# Patient Record
Sex: Male | Born: 1970
Health system: Southern US, Community
[De-identification: ages and names within clinical notes are randomized; demographics above are authoritative.]

## PROBLEM LIST (undated history)

## (undated) DIAGNOSIS — N189 Chronic kidney disease, unspecified: Secondary | ICD-10-CM

## (undated) DIAGNOSIS — E785 Hyperlipidemia, unspecified: Secondary | ICD-10-CM

## (undated) DIAGNOSIS — Z87442 Personal history of urinary calculi: Secondary | ICD-10-CM

## (undated) DIAGNOSIS — Z8669 Personal history of other diseases of the nervous system and sense organs: Secondary | ICD-10-CM

## (undated) DIAGNOSIS — F419 Anxiety disorder, unspecified: Secondary | ICD-10-CM

## (undated) DIAGNOSIS — E669 Obesity, unspecified: Secondary | ICD-10-CM

## (undated) DIAGNOSIS — J309 Allergic rhinitis, unspecified: Secondary | ICD-10-CM

## (undated) DIAGNOSIS — M545 Low back pain: Secondary | ICD-10-CM

## (undated) DIAGNOSIS — T7840XA Allergy, unspecified, initial encounter: Secondary | ICD-10-CM

## (undated) DIAGNOSIS — G709 Myoneural disorder, unspecified: Secondary | ICD-10-CM

## (undated) DIAGNOSIS — F329 Major depressive disorder, single episode, unspecified: Secondary | ICD-10-CM

## (undated) DIAGNOSIS — J45909 Unspecified asthma, uncomplicated: Secondary | ICD-10-CM

## (undated) DIAGNOSIS — I10 Essential (primary) hypertension: Secondary | ICD-10-CM

## (undated) DIAGNOSIS — D649 Anemia, unspecified: Secondary | ICD-10-CM

## (undated) DIAGNOSIS — F32A Depression, unspecified: Secondary | ICD-10-CM

## (undated) HISTORY — DX: Low back pain: M54.5

## (undated) HISTORY — DX: Allergy, unspecified, initial encounter: T78.40XA

## (undated) HISTORY — DX: Allergic rhinitis, unspecified: J30.9

## (undated) HISTORY — DX: Personal history of other diseases of the nervous system and sense organs: Z86.69

## (undated) HISTORY — DX: Chronic kidney disease, unspecified: N18.9

## (undated) HISTORY — DX: Myoneural disorder, unspecified: G70.9

## (undated) HISTORY — DX: Obesity, unspecified: E66.9

## (undated) HISTORY — PX: NO PAST SURGERIES: SHX2092

## (undated) HISTORY — DX: Hyperlipidemia, unspecified: E78.5

## (undated) HISTORY — DX: Depression, unspecified: F32.A

## (undated) HISTORY — DX: Unspecified asthma, uncomplicated: J45.909

## (undated) HISTORY — DX: Personal history of urinary calculi: Z87.442

## (undated) HISTORY — DX: Anemia, unspecified: D64.9

---
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
## 2004-10-17 ENCOUNTER — Ambulatory Visit: Payer: Self-pay | Admitting: Internal Medicine

## 2005-03-10 ENCOUNTER — Ambulatory Visit: Payer: Self-pay | Admitting: Internal Medicine

## 2005-09-01 ENCOUNTER — Ambulatory Visit: Payer: Self-pay | Admitting: Internal Medicine

## 2005-10-01 ENCOUNTER — Ambulatory Visit: Payer: Self-pay | Admitting: Internal Medicine

## 2007-03-23 ENCOUNTER — Ambulatory Visit: Payer: Self-pay | Admitting: Internal Medicine

## 2007-03-23 LAB — CONVERTED CEMR LAB
ALT: 23 units/L (ref 0–40)
AST: 22 units/L (ref 0–37)
Basophils Relative: 0.2 % (ref 0.0–1.0)
Bilirubin, Direct: 0.1 mg/dL (ref 0.0–0.3)
CO2: 34 meq/L — ABNORMAL HIGH (ref 19–32)
Calcium: 9.5 mg/dL (ref 8.4–10.5)
Chloride: 107 meq/L (ref 96–112)
Creatinine, Ser: 1.1 mg/dL (ref 0.4–1.5)
Eosinophils Relative: 1.8 % (ref 0.0–5.0)
Glucose, Bld: 93 mg/dL (ref 70–99)
HDL: 26.3 mg/dL — ABNORMAL LOW (ref >39.0)
Platelets: 260 10*3/uL (ref 150–400)
RBC: 5.37 M/uL (ref 4.22–5.81)
Total Protein: 7.3 g/dL (ref 6.0–8.3)
Triglycerides: 238 mg/dL (ref 0–149)
VLDL: 48 mg/dL — ABNORMAL HIGH (ref 0–40)
WBC: 5.9 10*3/uL (ref 4.5–10.5)

## 2007-03-30 ENCOUNTER — Ambulatory Visit: Payer: Self-pay | Admitting: Internal Medicine

## 2007-05-03 DIAGNOSIS — J45909 Unspecified asthma, uncomplicated: Secondary | ICD-10-CM | POA: Insufficient documentation

## 2007-05-03 DIAGNOSIS — J309 Allergic rhinitis, unspecified: Secondary | ICD-10-CM | POA: Insufficient documentation

## 2007-05-03 DIAGNOSIS — E785 Hyperlipidemia, unspecified: Secondary | ICD-10-CM

## 2007-05-03 DIAGNOSIS — E669 Obesity, unspecified: Secondary | ICD-10-CM

## 2007-05-03 HISTORY — DX: Unspecified asthma, uncomplicated: J45.909

## 2007-05-03 HISTORY — DX: Allergic rhinitis, unspecified: J30.9

## 2007-05-03 HISTORY — DX: Obesity, unspecified: E66.9

## 2007-05-03 HISTORY — DX: Hyperlipidemia, unspecified: E78.5

## 2008-03-24 ENCOUNTER — Ambulatory Visit: Payer: Self-pay | Admitting: Internal Medicine

## 2008-03-24 LAB — CONVERTED CEMR LAB
ALT: 31 units/L (ref 0–53)
AST: 25 units/L (ref 0–37)
Alkaline Phosphatase: 54 units/L (ref 39–117)
Bilirubin Urine: NEGATIVE
Bilirubin, Direct: 0.1 mg/dL (ref 0.0–0.3)
CO2: 30 meq/L (ref 19–32)
Chloride: 102 meq/L (ref 96–112)
Cholesterol: 222 mg/dL (ref 0–200)
Glucose, Bld: 86 mg/dL (ref 70–99)
Hemoglobin: 15.3 g/dL (ref 13.0–17.0)
Lymphocytes Relative: 31 % (ref 12.0–46.0)
Monocytes Relative: 9.7 % (ref 3.0–12.0)
Neutro Abs: 2.9 10*3/uL (ref 1.4–7.7)
Neutrophils Relative %: 56.3 % (ref 43.0–77.0)
Potassium: 4.1 meq/L (ref 3.5–5.1)
Protein, U semiquant: NEGATIVE
RDW: 13.1 % (ref 11.5–14.6)
Sodium: 140 meq/L (ref 135–145)
Total CHOL/HDL Ratio: 7.9
Total Protein: 6.9 g/dL (ref 6.0–8.3)
Urobilinogen, UA: 0.2
VLDL: 71 mg/dL — ABNORMAL HIGH (ref 0–40)
WBC Urine, dipstick: NEGATIVE

## 2008-03-30 ENCOUNTER — Ambulatory Visit: Payer: Self-pay | Admitting: Internal Medicine

## 2008-03-30 DIAGNOSIS — M545 Low back pain, unspecified: Secondary | ICD-10-CM | POA: Insufficient documentation

## 2008-03-30 DIAGNOSIS — Z87442 Personal history of urinary calculi: Secondary | ICD-10-CM | POA: Insufficient documentation

## 2008-03-30 HISTORY — DX: Personal history of urinary calculi: Z87.442

## 2008-03-30 HISTORY — DX: Low back pain, unspecified: M54.50

## 2009-09-20 ENCOUNTER — Ambulatory Visit: Payer: Self-pay | Admitting: Internal Medicine

## 2009-09-20 LAB — CONVERTED CEMR LAB
ALT: 34 units/L (ref 0–53)
AST: 28 units/L (ref 0–37)
Alkaline Phosphatase: 52 units/L (ref 39–117)
BUN: 15 mg/dL (ref 6–23)
Basophils Absolute: 0 10*3/uL (ref 0.0–0.1)
Blood in Urine, dipstick: NEGATIVE
Calcium: 9.5 mg/dL (ref 8.4–10.5)
Eosinophils Relative: 2.5 % (ref 0.0–5.0)
GFR calc non Af Amer: 88.58 mL/min (ref >60)
HCT: 44 % (ref 39.0–52.0)
HDL: 28.3 mg/dL — ABNORMAL LOW (ref >39.00)
Hemoglobin: 15.4 g/dL (ref 13.0–17.0)
Ketones, urine, test strip: NEGATIVE
Lymphocytes Relative: 27.2 % (ref 12.0–46.0)
Lymphs Abs: 1.6 10*3/uL (ref 0.7–4.0)
Monocytes Relative: 9.6 % (ref 3.0–12.0)
Nitrite: NEGATIVE
Platelets: 192 10*3/uL (ref 150.0–400.0)
Potassium: 4 meq/L (ref 3.5–5.1)
Protein, U semiquant: NEGATIVE
RDW: 12.7 % (ref 11.5–14.6)
Sodium: 143 meq/L (ref 135–145)
TSH: 1.46 microintl units/mL (ref 0.35–5.50)
Total Bilirubin: 0.9 mg/dL (ref 0.3–1.2)
Triglycerides: 290 mg/dL — ABNORMAL HIGH (ref 0.0–149.0)
VLDL: 58 mg/dL — ABNORMAL HIGH (ref 0.0–40.0)
WBC Urine, dipstick: NEGATIVE
WBC: 5.9 10*3/uL (ref 4.5–10.5)

## 2009-09-28 ENCOUNTER — Ambulatory Visit: Payer: Self-pay | Admitting: Internal Medicine

## 2010-11-21 ENCOUNTER — Other Ambulatory Visit: Payer: Self-pay | Admitting: Internal Medicine

## 2010-12-16 ENCOUNTER — Other Ambulatory Visit: Payer: Self-pay | Admitting: Internal Medicine

## 2010-12-25 ENCOUNTER — Other Ambulatory Visit: Payer: Self-pay | Admitting: Internal Medicine

## 2011-01-15 ENCOUNTER — Other Ambulatory Visit: Payer: Self-pay | Admitting: Internal Medicine

## 2011-01-15 NOTE — Telephone Encounter (Signed)
Singular denied - last seen 09/2009 - last refill was instructed to make rov

## 2011-03-04 NOTE — Assessment & Plan Note (Signed)
[facility] OFFICE NOTE   MINDY, BEHNKEN                     MRN:          [id number]  DATE:03/30/2007                            DOB:          08-22-1971    A 40 year old gentleman who was seen today for an annual health  assessment.  He has history of asthma, allergic rhinitis, also a remote  history of kidney stones.  Medical regimen includes Singulair, Advair,  __________  albuterol, Flonase, Claritin.  His asthma is quite stable  although he still uses albuterol on the average of 2 times weekly.  He  only takes his maintenance drugs on the average of 4-5 times per week.   FAMILY HISTORY:  Both parents have been diagnosed recently with  diabetes, father has a history of paraplegia, mother with hypertension  and also diabetes insipidus.  Maternal grandmother had lung cancer.  An  uncle less than 50 had coronary artery disease.   EXAMINATION:  Revealed a mildly overweight male, no acute distress.  Blood pressure is 136/80.  FUNDI, EAR, NOSE, AND THROAT:  Clear.  NECK:  No bruits or adenopathy.  CHEST:  Clear.  CARDIOVASCULAR:  Normal heart sounds, no murmurs.  ABDOMEN:  Obese, soft but nontender, no organomegaly.  EXTERNAL GENITALIA:  Normal.  EXTREMITIES:  Revealed a diminished left dorsalis pedis pulses, other  peripheral pulses were normal.  NEURO:  Negative.   IMPRESSION:  1. Asthma.  2. Allergic rhinitis.  3. Dyslipidemia.  4. Family history of diabetes.  5. Exogenous obesity.   DISPOSITION:  Lifestyle issues discussed at length as far as better  diet, more exercise, and weight loss.  He will be given a trial of  Niaspan.  Has been asked to return in 3 month for a reassessment of his  lipid status and blood pressure.     Gordy Savers, MD  Electronically Signed    PFK/MedQ  DD: 03/30/2007  DT: 03/30/2007  Job #: [id number]

## 2011-03-14 ENCOUNTER — Other Ambulatory Visit (INDEPENDENT_AMBULATORY_CARE_PROVIDER_SITE_OTHER): Payer: 59

## 2011-03-14 DIAGNOSIS — Z0000 Encounter for general adult medical examination without abnormal findings: Secondary | ICD-10-CM

## 2011-03-14 LAB — CBC WITH DIFFERENTIAL/PLATELET
Eosinophils Absolute: 0.1 10*3/uL (ref 0.0–0.7)
Eosinophils Relative: 1.8 % (ref 0.0–5.0)
HCT: 43.7 % (ref 39.0–52.0)
Lymphs Abs: 1.9 10*3/uL (ref 0.7–4.0)
MCHC: 34.7 g/dL (ref 30.0–36.0)
MCV: 88.4 fl (ref 78.0–100.0)
Monocytes Absolute: 0.7 10*3/uL (ref 0.1–1.0)
Neutrophils Relative %: 59.6 % (ref 43.0–77.0)
Platelets: 180 10*3/uL (ref 150.0–400.0)
RDW: 14 % (ref 11.5–14.6)
WBC: 6.8 10*3/uL (ref 4.5–10.5)

## 2011-03-14 LAB — BASIC METABOLIC PANEL
BUN: 19 mg/dL (ref 6–23)
CO2: 29 mEq/L (ref 19–32)
Chloride: 104 mEq/L (ref 96–112)
Creatinine, Ser: 1 mg/dL (ref 0.4–1.5)
Glucose, Bld: 77 mg/dL (ref 70–99)
Potassium: 4.3 mEq/L (ref 3.5–5.1)

## 2011-03-14 LAB — POCT URINALYSIS DIPSTICK
Glucose, UA: NEGATIVE
Nitrite, UA: NEGATIVE
Protein, UA: NEGATIVE
Spec Grav, UA: 1.025
Urobilinogen, UA: 0.2

## 2011-03-14 LAB — TSH: TSH: 1.08 u[IU]/mL (ref 0.35–5.50)

## 2011-03-14 LAB — LDL CHOLESTEROL, DIRECT: Direct LDL: 114.7 mg/dL

## 2011-03-14 LAB — LIPID PANEL
HDL: 34.5 mg/dL — ABNORMAL LOW (ref >39.00)
Total CHOL/HDL Ratio: 6
VLDL: 84.4 mg/dL — ABNORMAL HIGH (ref 0.0–40.0)

## 2011-03-14 LAB — HEPATIC FUNCTION PANEL
ALT: 37 U/L (ref 0–53)
Bilirubin, Direct: 0.1 mg/dL (ref 0.0–0.3)
Total Bilirubin: 0.6 mg/dL (ref 0.3–1.2)

## 2011-03-25 ENCOUNTER — Encounter: Payer: Self-pay | Admitting: Internal Medicine

## 2011-03-31 ENCOUNTER — Encounter: Payer: Self-pay | Admitting: Internal Medicine

## 2011-03-31 ENCOUNTER — Ambulatory Visit (INDEPENDENT_AMBULATORY_CARE_PROVIDER_SITE_OTHER): Payer: 59 | Admitting: Internal Medicine

## 2011-03-31 VITALS — BP 120/80 | Temp 98.4°F | Resp 18 | Ht 71.0 in | Wt 258.0 lb

## 2011-03-31 DIAGNOSIS — Z0000 Encounter for general adult medical examination without abnormal findings: Secondary | ICD-10-CM

## 2011-03-31 MED ORDER — MONTELUKAST SODIUM 10 MG PO TABS: 10.0000 mg | ORAL_TABLET | Freq: Every day | ORAL | Status: DC

## 2011-03-31 MED ORDER — ALBUTEROL SULFATE HFA 108 (90 BASE) MCG/ACT IN AERS: 2.0000 | INHALATION_SPRAY | RESPIRATORY_TRACT | Status: DC | PRN

## 2011-03-31 MED ORDER — FLUTICASONE-SALMETEROL 250-50 MCG/DOSE IN AEPB: 1.0000 | INHALATION_SPRAY | Freq: Two times a day (BID) | RESPIRATORY_TRACT | Status: DC

## 2011-03-31 MED ORDER — FLUTICASONE PROPIONATE 50 MCG/ACT NA SUSP: 2.0000 | Freq: Every day | NASAL | Status: DC

## 2011-03-31 NOTE — Patient Instructions (Signed)
It is important that you exercise regularly, at least 20 minutes 3 to 4 times per week.  If you develop chest pain or shortness of breath seek  medical attention.  You need to lose weight.  Consider a lower calorie diet and regular exercise.  Call or return to clinic prn if these symptoms worsen or fail to improve as anticipated.  

## 2011-03-31 NOTE — Progress Notes (Signed)
Subjective:    Patient ID: Jason Benitez, male    DOB: Apr 21, 1971, 40 y.o.   MRN: [id number]  HPI   CC: CPX and labs done. Out of meds..  History of Present Illness:  40 year old patient who is seen today for a health maintenance examination. Medical problems include the allergic rhinitis and a history of asthma. He has exogenous Obesity and history of dyslipidemia. He has done quite well. He does require rescue albuterol, usually two to 3 times per week.  Allergies (verified):  No Known Drug Allergies  Past History:  Past Medical History:  Low back pain  Allergic rhinitis  Asthma  Nephrolithiasis, hx of  exogenous obesity  Past Surgical History:  none  Family History:  Reviewed history from 03/30/2008 and no changes required.  father history of diabetes, but pancreatic cancer age 60  mother history of hypertension, diabetes insipidus  maternal grandmother lung cancer  one uncle with CAD at age less than age 59  Social History:  Reviewed history from 03/30/2008 and no changes required.  Married  two young sons  discontinuation of tobacco, January 2009     Review of Systems  Constitutional: Negative for fever, chills, activity change, appetite change and fatigue.  HENT: Negative for hearing loss, ear pain, congestion, rhinorrhea, sneezing, mouth sores, trouble swallowing, neck pain, neck stiffness, dental problem, voice change, sinus pressure and tinnitus.   Eyes: Negative for photophobia, pain, redness and visual disturbance.  Respiratory: Negative for apnea, cough, choking, chest tightness, shortness of breath and wheezing.   Cardiovascular: Negative for chest pain, palpitations and leg swelling.  Gastrointestinal: Negative for nausea, vomiting, abdominal pain, diarrhea, constipation, blood in stool, abdominal distention, anal bleeding and rectal pain.       Complains of irritable bowel with 3-5 daily bowel movements  Genitourinary: Negative for dysuria, urgency,  frequency, hematuria, flank pain, decreased urine volume, discharge, penile swelling, scrotal swelling, difficulty urinating, genital sores and testicular pain.  Musculoskeletal: Negative for myalgias, back pain, joint swelling, arthralgias and gait problem.  Skin: Negative for color change, rash and wound.  Neurological: Negative for dizziness, tremors, seizures, syncope, facial asymmetry, speech difficulty, weakness, light-headedness, numbness and headaches.  Hematological: Negative for adenopathy. Does not bruise/bleed easily.  Psychiatric/Behavioral: Negative for suicidal ideas, hallucinations, behavioral problems, confusion, sleep disturbance, self-injury, dysphoric mood, decreased concentration and agitation. The patient is not nervous/anxious.        Objective:   Physical Exam  Constitutional: He appears well-developed and well-nourished.  HENT:  Head: Normocephalic and atraumatic.  Right Ear: External ear normal.  Left Ear: External ear normal.  Nose: Nose normal.  Mouth/Throat: Oropharynx is clear and moist.  Eyes: Conjunctivae and EOM are normal. Pupils are equal, round, and reactive to light. No scleral icterus.  Neck: Normal range of motion. Neck supple. No JVD present. No thyromegaly present.  Cardiovascular: Regular rhythm, normal heart sounds and intact distal pulses.  Exam reveals no gallop and no friction rub.   No murmur heard. Pulmonary/Chest: Effort normal and breath sounds normal. He exhibits no tenderness.  Abdominal: Soft. Bowel sounds are normal. He exhibits no distension and no mass. There is no tenderness.  Genitourinary: Prostate normal and penis normal.  Musculoskeletal: Normal range of motion. He exhibits no edema and no tenderness.  Lymphadenopathy:    He has no cervical adenopathy.  Neurological: He is alert. He has normal reflexes. No cranial nerve deficit. Coordination normal.  Skin: Skin is warm and dry. No rash noted.  Psychiatric: He  has a normal mood  and affect. His behavior is normal.          Assessment & Plan:    Annual clinical exam Asthma stable  mild exogenous obesity

## 2012-12-16 ENCOUNTER — Other Ambulatory Visit: Payer: 59

## 2012-12-23 ENCOUNTER — Encounter: Payer: 59 | Admitting: Internal Medicine

## 2013-03-08 ENCOUNTER — Encounter: Payer: Self-pay | Admitting: Internal Medicine

## 2013-03-08 ENCOUNTER — Other Ambulatory Visit (INDEPENDENT_AMBULATORY_CARE_PROVIDER_SITE_OTHER): Payer: 59

## 2013-03-08 DIAGNOSIS — Z0000 Encounter for general adult medical examination without abnormal findings: Secondary | ICD-10-CM

## 2013-03-08 LAB — POCT URINALYSIS DIPSTICK
Bilirubin, UA: NEGATIVE
Glucose, UA: NEGATIVE
Leukocytes, UA: NEGATIVE
Nitrite, UA: NEGATIVE
pH, UA: 6

## 2013-03-08 LAB — CBC WITH DIFFERENTIAL/PLATELET
Basophils Absolute: 0 10*3/uL (ref 0.0–0.1)
Basophils Relative: 0.8 % (ref 0.0–3.0)
Eosinophils Absolute: 0.1 10*3/uL (ref 0.0–0.7)
Hemoglobin: 15.4 g/dL (ref 13.0–17.0)
Lymphocytes Relative: 28.1 % (ref 12.0–46.0)
Lymphs Abs: 1.5 10*3/uL (ref 0.7–4.0)
MCHC: 34.5 g/dL (ref 30.0–36.0)
MCV: 85.6 fl (ref 78.0–100.0)
Monocytes Absolute: 0.5 10*3/uL (ref 0.1–1.0)
Monocytes Relative: 8.8 % (ref 3.0–12.0)
Neutro Abs: 3.3 10*3/uL (ref 1.4–7.7)
Platelets: 204 10*3/uL (ref 150.0–400.0)
RDW: 13.8 % (ref 11.5–14.6)

## 2013-03-08 LAB — HEPATIC FUNCTION PANEL
Alkaline Phosphatase: 49 U/L (ref 39–117)
Bilirubin, Direct: 0 mg/dL (ref 0.0–0.3)
Total Bilirubin: 0.9 mg/dL (ref 0.3–1.2)
Total Protein: 7.1 g/dL (ref 6.0–8.3)

## 2013-03-08 LAB — BASIC METABOLIC PANEL
BUN: 16 mg/dL (ref 6–23)
CO2: 30 mEq/L (ref 19–32)
Calcium: 9.1 mg/dL (ref 8.4–10.5)
Creatinine, Ser: 1.2 mg/dL (ref 0.4–1.5)

## 2013-03-08 LAB — LIPID PANEL: Triglycerides: 191 mg/dL — ABNORMAL HIGH (ref 0.0–149.0)

## 2013-03-08 LAB — TSH: TSH: 0.62 u[IU]/mL (ref 0.35–5.50)

## 2013-03-15 ENCOUNTER — Ambulatory Visit (INDEPENDENT_AMBULATORY_CARE_PROVIDER_SITE_OTHER): Payer: 59 | Admitting: Internal Medicine

## 2013-03-15 ENCOUNTER — Encounter: Payer: Self-pay | Admitting: Internal Medicine

## 2013-03-15 VITALS — BP 140/96 | HR 108 | Temp 98.5°F | Resp 20 | Ht 70.0 in | Wt 236.0 lb

## 2013-03-15 DIAGNOSIS — J309 Allergic rhinitis, unspecified: Secondary | ICD-10-CM

## 2013-03-15 DIAGNOSIS — J45909 Unspecified asthma, uncomplicated: Secondary | ICD-10-CM

## 2013-03-15 DIAGNOSIS — E785 Hyperlipidemia, unspecified: Secondary | ICD-10-CM

## 2013-03-15 DIAGNOSIS — E669 Obesity, unspecified: Secondary | ICD-10-CM

## 2013-03-15 DIAGNOSIS — Z0000 Encounter for general adult medical examination without abnormal findings: Secondary | ICD-10-CM

## 2013-03-15 MED ORDER — FLUTICASONE PROPIONATE 50 MCG/ACT NA SUSP: 2.0000 | Freq: Every day | NASAL | Status: DC

## 2013-03-15 MED ORDER — ALBUTEROL SULFATE HFA 108 (90 BASE) MCG/ACT IN AERS: 2.0000 | INHALATION_SPRAY | RESPIRATORY_TRACT | Status: DC | PRN

## 2013-03-15 MED ORDER — FLUTICASONE-SALMETEROL 250-50 MCG/DOSE IN AEPB: 1.0000 | INHALATION_SPRAY | Freq: Two times a day (BID) | RESPIRATORY_TRACT | Status: DC

## 2013-03-15 NOTE — Patient Instructions (Addendum)
It is important that you exercise regularly, at least 20 minutes 3 to 4 times per week.  If you develop chest pain or shortness of breath seek  medical attention.  You need to lose weight.  Consider a lower calorie diet and regular exercise.Health Maintenance, Males A healthy lifestyle and preventative care can promote health and wellness.  Maintain regular health, dental, and eye exams.  Eat a healthy diet. Foods like vegetables, fruits, whole grains, low-fat dairy products, and lean protein foods contain the nutrients you need without too many calories. Decrease your intake of foods high in solid fats, added sugars, and salt. Get information about a proper diet from your caregiver, if necessary.  Regular physical exercise is one of the most important things you can do for your health. Most adults should get at least 150 minutes of moderate-intensity exercise (any activity that increases your heart rate and causes you to sweat) each week. In addition, most adults need muscle-strengthening exercises on 2 or more days a week.   Maintain a healthy weight. The body mass index (BMI) is a screening tool to identify possible weight problems. It provides an estimate of body fat based on height and weight. Your caregiver can help determine your BMI, and can help you achieve or maintain a healthy weight. For adults 20 years and older:  A BMI below 18.5 is considered underweight.  A BMI of 18.5 to 24.9 is normal.  A BMI of 25 to 29.9 is considered overweight.  A BMI of 30 and above is considered obese.  Maintain normal blood lipids and cholesterol by exercising and minimizing your intake of saturated fat. Eat a balanced diet with plenty of fruits and vegetables. Blood tests for lipids and cholesterol should begin at age 20 and be repeated every 5 years. If your lipid or cholesterol levels are high, you are over 50, or you are a high risk for heart disease, you may need your cholesterol levels checked  more frequently.Ongoing high lipid and cholesterol levels should be treated with medicines, if diet and exercise are not effective.  If you smoke, find out from your caregiver how to quit. If you do not use tobacco, do not start.  If you choose to drink alcohol, do not exceed 2 drinks per day. One drink is considered to be 12 ounces (355 mL) of beer, 5 ounces (148 mL) of wine, or 1.5 ounces (44 mL) of liquor.  Avoid use of street drugs. Do not share needles with anyone. Ask for help if you need support or instructions about stopping the use of drugs.  High blood pressure causes heart disease and increases the risk of stroke. Blood pressure should be checked at least every 1 to 2 years. Ongoing high blood pressure should be treated with medicines if weight loss and exercise are not effective.  If you are 45 to 42 years old, ask your caregiver if you should take aspirin to prevent heart disease.  Diabetes screening involves taking a blood sample to check your fasting blood sugar level. This should be done once every 3 years, after age 45, if you are within normal weight and without risk factors for diabetes. Testing should be considered at a younger age or be carried out more frequently if you are overweight and have at least 1 risk factor for diabetes.  Colorectal cancer can be detected and often prevented. Most routine colorectal cancer screening begins at the age of 50 and continues through age 75. However, your caregiver   may recommend screening at an earlier age if you have risk factors for colon cancer. On a yearly basis, your caregiver may provide home test kits to check for hidden blood in the stool. Use of a small camera at the end of a tube, to directly examine the colon (sigmoidoscopy or colonoscopy), can detect the earliest forms of colorectal cancer. Talk to your caregiver about this at age 50, when routine screening begins. Direct examination of the colon should be repeated every 5 to 10  years through age 75, unless early forms of pre-cancerous polyps or small growths are found.  Hepatitis C blood testing is recommended for all people born from 1945 through 1965 and any individual with known risks for hepatitis C.  Healthy men should no longer receive prostate-specific antigen (PSA) blood tests as part of routine cancer screening. Consult with your caregiver about prostate cancer screening.  Testicular cancer screening is not recommended for adolescents or adult males who have no symptoms. Screening includes self-exam, caregiver exam, and other screening tests. Consult with your caregiver about any symptoms you have or any concerns you have about testicular cancer.  Practice safe sex. Use condoms and avoid high-risk sexual practices to reduce the spread of sexually transmitted infections (STIs).  Use sunscreen with a sun protection factor (SPF) of 30 or greater. Apply sunscreen liberally and repeatedly throughout the day. You should seek shade when your shadow is shorter than you. Protect yourself by wearing long sleeves, pants, a wide-brimmed hat, and sunglasses year round, whenever you are outdoors.  Notify your caregiver of new moles or changes in moles, especially if there is a change in shape or color. Also notify your caregiver if a mole is larger than the size of a pencil eraser.  A one-time screening for abdominal aortic aneurysm (AAA) and surgical repair of large AAAs by sound wave imaging (ultrasonography) is recommended for ages 65 to 75 years who are current or former smokers.  Stay current with your immunizations. Document Released: 04/03/2008 Document Revised: 12/29/2011 Document Reviewed: 03/03/2011 [employer] Patient Information 2014 [employer].  

## 2013-03-15 NOTE — Progress Notes (Signed)
Patient ID: Jason Benitez, male   DOB: Jan 27, 1971, 42 y.o.   MRN: [id number]  Subjective:    Patient ID: Jason Benitez, male    DOB: [birth year], 42 y.o.   MRN: [id number]  HPI   CC: CPX and labs done. Out of meds..  History of Present Illness:   42 -year-old patient who is seen today for a health maintenance examination. Medical problems include the allergic rhinitis and a history of asthma. He has exogenous Obesity and history of dyslipidemia. He has done quite well. He does require rescue albuterol, usually two to 3 times per week. He no longer takes Advair.  Approximately 6 months ago a laboratory screen was performed at work that revealed worsening dyslipidemia. For the past 6 months he has been on a much better diet and exercise regimen and has lost 30 pounds in weight.  Allergies (verified):  No Known Drug Allergies   Past History:  Past Medical History:  Low back pain  Allergic rhinitis  Asthma  Nephrolithiasis, hx of  exogenous obesity   Past Surgical History:  none   Family History:  Reviewed history from 03/30/2008 and no changes required.  father history of diabetes, but pancreatic cancer age 81  mother history of hypertension, diabetes insipidus  maternal grandmother lung cancer  one uncle with CAD at age less than age 19   Social History:  Reviewed history from 03/30/2008 and no changes required.  Married  two young sons  discontinuation of tobacco, January 2009     Review of Systems  Constitutional: Negative for fever, chills, activity change, appetite change and fatigue.  HENT: Negative for hearing loss, ear pain, congestion, rhinorrhea, sneezing, mouth sores, trouble swallowing, neck pain, neck stiffness, dental problem, voice change, sinus pressure and tinnitus.   Eyes: Negative for photophobia, pain, redness and visual disturbance.  Respiratory: Negative for apnea, cough, choking, chest tightness, shortness of breath and wheezing.   Cardiovascular:  Negative for chest pain, palpitations and leg swelling.  Gastrointestinal: Negative for nausea, vomiting, abdominal pain, diarrhea, constipation, blood in stool, abdominal distention, anal bleeding and rectal pain.       Complains of irritable bowel with 3-5 daily bowel movements  Genitourinary: Negative for dysuria, urgency, frequency, hematuria, flank pain, decreased urine volume, discharge, penile swelling, scrotal swelling, difficulty urinating, genital sores and testicular pain.  Musculoskeletal: Negative for myalgias, back pain, joint swelling, arthralgias and gait problem.  Skin: Negative for color change, rash and wound.  Neurological: Negative for dizziness, tremors, seizures, syncope, facial asymmetry, speech difficulty, weakness, light-headedness, numbness and headaches.  Hematological: Negative for adenopathy. Does not bruise/bleed easily.  Psychiatric/Behavioral: Negative for suicidal ideas, hallucinations, behavioral problems, confusion, sleep disturbance, self-injury, dysphoric mood, decreased concentration and agitation. The patient is not nervous/anxious.        Objective:   Physical Exam  Constitutional: He appears well-developed and well-nourished.  BP  130/82  HENT:  Head: Normocephalic and atraumatic.  Right Ear: External ear normal.  Left Ear: External ear normal.  Nose: Nose normal.  Mouth/Throat: Oropharynx is clear and moist.  Eyes: Conjunctivae and EOM are normal. Pupils are equal, round, and reactive to light. No scleral icterus.  Neck: Normal range of motion. Neck supple. No JVD present. No thyromegaly present.  Cardiovascular: Regular rhythm, normal heart sounds and intact distal pulses.  Exam reveals no gallop and no friction rub.   No murmur heard. Pulmonary/Chest: Effort normal and breath sounds normal. He exhibits no tenderness.  Abdominal: Soft. Bowel sounds  are normal. He exhibits no distension and no mass. There is no tenderness.  Genitourinary:  Prostate normal and penis normal.  Musculoskeletal: Normal range of motion. He exhibits no edema and no tenderness.  Lymphadenopathy:    He has no cervical adenopathy.  Neurological: He is alert. He has normal reflexes. No cranial nerve deficit. Coordination normal.  Skin: Skin is warm and dry. No rash noted.  Psychiatric: He has a normal mood and affect. His behavior is normal.          Assessment & Plan:    Annual clinical exam Asthma stable  exogenous obesity  continue efforts at diet and exercise Dyslipidemia. Much improved with lifestyle changes

## 2013-08-25 ENCOUNTER — Other Ambulatory Visit: Payer: Self-pay

## 2014-03-09 ENCOUNTER — Ambulatory Visit (INDEPENDENT_AMBULATORY_CARE_PROVIDER_SITE_OTHER): Payer: 59 | Admitting: Internal Medicine

## 2014-03-09 ENCOUNTER — Encounter: Payer: Self-pay | Admitting: Internal Medicine

## 2014-03-09 VITALS — BP 120/98 | HR 90 | Temp 98.4°F | Wt 248.0 lb

## 2014-03-09 DIAGNOSIS — E669 Obesity, unspecified: Secondary | ICD-10-CM

## 2014-03-09 DIAGNOSIS — J45909 Unspecified asthma, uncomplicated: Secondary | ICD-10-CM

## 2014-03-09 DIAGNOSIS — F410 Panic disorder [episodic paroxysmal anxiety] without agoraphobia: Secondary | ICD-10-CM

## 2014-03-09 MED ORDER — BUPROPION HCL ER (SR) 150 MG PO TB12: 150.0000 mg | ORAL_TABLET | Freq: Two times a day (BID) | ORAL | Status: DC

## 2014-03-09 NOTE — Progress Notes (Signed)
Pre visit review using our clinic review tool, if applicable. No additional management support is needed unless otherwise documented below in the visit note. 

## 2014-03-09 NOTE — Patient Instructions (Addendum)
Call or return to clinic prn if these symptoms worsen or fail to improve as anticipated.  Moderate caffeine and nicotine exposure    It is important that you exercise regularly, at least 20 minutes 3 to 4 times per week.  If you develop chest pain or shortness of breath seek  medical attention.
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
## 2014-07-19 ENCOUNTER — Ambulatory Visit (INDEPENDENT_AMBULATORY_CARE_PROVIDER_SITE_OTHER): Payer: 59 | Admitting: Internal Medicine

## 2014-07-19 ENCOUNTER — Encounter: Payer: Self-pay | Admitting: Internal Medicine

## 2014-07-19 VITALS — BP 130/90 | HR 116 | Temp 98.8°F | Resp 20 | Ht 70.0 in | Wt 251.0 lb

## 2014-07-19 DIAGNOSIS — R071 Chest pain on breathing: Secondary | ICD-10-CM

## 2014-07-19 DIAGNOSIS — R0789 Other chest pain: Secondary | ICD-10-CM

## 2014-07-19 DIAGNOSIS — K219 Gastro-esophageal reflux disease without esophagitis: Secondary | ICD-10-CM

## 2014-07-19 MED ORDER — OMEPRAZOLE 20 MG PO CPDR: 20.0000 mg | DELAYED_RELEASE_CAPSULE | Freq: Every day | ORAL | Status: DC

## 2014-07-19 NOTE — Progress Notes (Signed)
Pre visit review using our clinic review tool, if applicable. No additional management support is needed unless otherwise documented below in the visit note. 

## 2014-07-19 NOTE — Progress Notes (Signed)
Subjective:    Patient ID: Jason Benitez, male    DOB: 1971-06-27, 43 y.o.   MRN: [id number]  HPI  43 year old patient who has a history of mild asthma and allergic rhinitis.  For the past week.  He has had some soreness across the left anterior chest wall with some radiation to the left posterior chest wall area.  He describes some intermittent abdominal pain and diarrhea.  He also describes a some increasing fullness and burning associated with meals.  These symptoms are worse at night when he lays flat.  At times, his left anterior chest wall pain seems aggravated by movement, especially flexion of the head and neck  Past Medical History  Diagnosis Date  . ALLERGIC RHINITIS 05/03/2007  . ASTHMA 05/03/2007  . DYSLIPIDEMIA 05/03/2007  . EXOGENOUS OBESITY 05/03/2007  . LOW BACK PAIN 03/30/2008  . NEPHROLITHIASIS, HX OF 03/30/2008    History   Social History  . Marital Status: Married    Spouse Name: N/A    Number of Children: N/A  . Years of Education: N/A   Occupational History  . Not on file.   Social History Main Topics  . Smoking status: Former Smoker    Quit date: 10/20/1990  . Smokeless tobacco: Never Used  . Alcohol Use: Yes  . Drug Use: No  . Sexual Activity: Not on file   Other Topics Concern  . Not on file   Social History Narrative  . No narrative on file    History reviewed. No pertinent past surgical history.  Family History  Problem Relation Age of Onset  . Diabetes Mother   . Hypertension Mother   . Diabetes Father   . Cancer Father     pancreatic  . Cancer Maternal Grandfather     lung ca    No Known Allergies  Current Outpatient Prescriptions on File Prior to Visit  Medication Sig Dispense Refill  . albuterol (PROAIR HFA) 108 (90 BASE) MCG/ACT inhaler Inhale 2 puffs into the lungs every 4 (four) hours as needed.  1 Inhaler  5  . buPROPion (WELLBUTRIN SR) 150 MG 12 hr tablet Take 1 tablet (150 mg total) by mouth 2 (two) times daily.  60  tablet  3  . fluticasone (FLONASE) 50 MCG/ACT nasal spray Place 2 sprays into the nose daily.  16 g  5   No current facility-administered medications on file prior to visit.    BP 130/90  Pulse 116  Temp(Src) 98.8 F (37.1 C) (Oral)  Resp 20  Ht 5\' 10"  (1.778 m)  Wt 251 lb (113.853 kg)  BMI 36.01 kg/m2  SpO2 98%     Review of Systems  Constitutional: Negative for fever, chills, appetite change and fatigue.  HENT: Negative for congestion, dental problem, ear pain, hearing loss, sore throat, tinnitus, trouble swallowing and voice change.   Eyes: Negative for pain, discharge and visual disturbance.  Respiratory: Negative for cough, chest tightness, wheezing and stridor.   Cardiovascular: Positive for chest pain. Negative for palpitations and leg swelling.  Gastrointestinal: Negative for nausea, vomiting, abdominal pain, diarrhea, constipation, blood in stool and abdominal distention.  Genitourinary: Negative for urgency, hematuria, flank pain, discharge, difficulty urinating and genital sores.  Musculoskeletal: Negative for arthralgias, back pain, gait problem, joint swelling, myalgias and neck stiffness.  Skin: Negative for rash.  Neurological: Negative for dizziness, syncope, speech difficulty, weakness, numbness and headaches.  Hematological: Negative for adenopathy. Does not bruise/bleed easily.  Psychiatric/Behavioral: Negative for behavioral problems  and dysphoric mood. The patient is not nervous/anxious.        Objective:   Physical Exam  Constitutional: He is oriented to person, place, and time. He appears well-developed and well-nourished. No distress.  HENT:  Head: Normocephalic.  Right Ear: External ear normal.  Left Ear: External ear normal.  Eyes: Conjunctivae and EOM are normal.  Neck: Normal range of motion.  Cardiovascular: Normal rate and normal heart sounds.   Pulmonary/Chest: Effort normal and breath sounds normal. No respiratory distress. He has no  wheezes. He has no rales. He exhibits no tenderness.  Abdominal: Bowel sounds are normal.  Musculoskeletal: Normal range of motion. He exhibits no edema and no tenderness.  Neurological: He is alert and oriented to person, place, and time.  Psychiatric: He has a normal mood and affect. His behavior is normal.          Assessment & Plan:   Believe that patient has a component of both chest wall discomfort, as well as reflux symptoms.  We'll place on an aggressive antireflux regimen and observe Asthma stable

## 2014-07-19 NOTE — Patient Instructions (Signed)
Avoids foods high in acid such as tomatoes citrus juices, and spicy foods.  Avoid eating within two hours of lying down or before exercising.  Do not overheat.  Try smaller more frequent meals.    Food Choices for Gastroesophageal Reflux Disease When you have gastroesophageal reflux disease (GERD), the foods you eat and your eating habits are very important. Choosing the right foods can help ease the discomfort of GERD. WHAT GENERAL GUIDELINES DO I NEED TO FOLLOW?  Choose fruits, vegetables, whole grains, low-fat dairy products, and low-fat meat, fish, and poultry.  Limit fats such as oils, salad dressings, butter, nuts, and avocado.  Keep a food diary to identify foods that cause symptoms.  Avoid foods that cause reflux. These may be different for different people.  Eat frequent small meals instead of three large meals each day.  Eat your meals slowly, in a relaxed setting.  Limit fried foods.  Cook foods using methods other than frying.  Avoid drinking alcohol.  Avoid drinking large amounts of liquids with your meals.  Avoid bending over or lying down until 2-3 hours after eating. WHAT FOODS ARE NOT RECOMMENDED? The following are some foods and drinks that may worsen your symptoms: Vegetables Tomatoes. Tomato juice. Tomato and spaghetti sauce. Chili peppers. Onion and garlic. Horseradish. Fruits Oranges, grapefruit, and lemon (fruit and juice). Meats High-fat meats, fish, and poultry. This includes hot dogs, ribs, ham, sausage, salami, and bacon. Dairy Whole milk and chocolate milk. Sour cream. Cream. Butter. Ice cream. Cream cheese.  Beverages Coffee and tea, with or without caffeine. Carbonated beverages or energy drinks. Condiments Hot sauce. Barbecue sauce.  Sweets/Desserts Chocolate and cocoa. Donuts. Peppermint and spearmint. Fats and Oils High-fat foods, including French fries and potato chips. Other Vinegar. Strong spices, such as black pepper, white pepper,  red pepper, cayenne, curry powder, cloves, ginger, and chili powder. The items listed above may not be a complete list of foods and beverages to avoid. Contact your dietitian for more information. Document Released: 10/06/2005 Document Revised: 10/11/2013 Document Reviewed: 08/10/2013 [employer] Patient Information 2015 [employer]. This information is not intended to replace advice given to you by your health care provider. Make sure you discuss any questions you have with your health care provider.  

## 2014-08-11 ENCOUNTER — Telehealth: Payer: Self-pay | Admitting: Internal Medicine

## 2014-08-11 MED ORDER — BUPROPION HCL ER (SR) 150 MG PO TB12: 150.0000 mg | ORAL_TABLET | Freq: Two times a day (BID) | ORAL | Status: DC

## 2014-08-11 NOTE — Telephone Encounter (Signed)
Pt notified Rx sent to pharmacy

## 2014-08-11 NOTE — Telephone Encounter (Signed)
Pt needs a another RX for buPROPion (WELLBUTRIN SR) 150 MG 12 hr tablet  [id number] - [place], [place] - [address]  He states he lost the original RX given and never got it filled.  He would like a callback to notify him when re-fill is sent or ready for pick-up.

## 2014-08-14 ENCOUNTER — Telehealth: Payer: Self-pay | Admitting: Internal Medicine

## 2014-08-14 NOTE — Telephone Encounter (Signed)
Cbc cmet and esr  Tramadol 50  #60 one TID prn  ROV if unimproved

## 2014-08-14 NOTE — Telephone Encounter (Signed)
Pt seen 9/30 for discomfort in left side , upper abdominal pain.. Pt is still having this pain, and soreness in his mid back around shoulder blades. Pt would like to have labs done to ensure nothing is going on or see what dr Raliegh Ip advises. pls advise.

## 2014-08-14 NOTE — Telephone Encounter (Signed)
Please see message and advise 

## 2014-08-15 ENCOUNTER — Other Ambulatory Visit: Payer: Self-pay | Admitting: Internal Medicine

## 2014-08-15 DIAGNOSIS — R1012 Left upper quadrant pain: Secondary | ICD-10-CM

## 2014-08-15 NOTE — Telephone Encounter (Signed)
Spoke to pt, told him Dr. Raliegh Ip said he can take Tramadol for pain, will call into pharmacy for him and also he ordered lab work if he could call back in AM to schedule lab appt for tomorrow. Pt verbalized understanding and stated that is fine, but does not need anything for pain. Told him okay. Lab orders put in EPIC.

## 2014-08-16 ENCOUNTER — Other Ambulatory Visit (INDEPENDENT_AMBULATORY_CARE_PROVIDER_SITE_OTHER): Payer: 59

## 2014-08-16 DIAGNOSIS — R1012 Left upper quadrant pain: Secondary | ICD-10-CM

## 2014-08-16 LAB — COMPREHENSIVE METABOLIC PANEL
ALBUMIN: 3.4 g/dL — AB (ref 3.5–5.2)
ALT: 42 U/L (ref 0–53)
AST: 34 U/L (ref 0–37)
Alkaline Phosphatase: 56 U/L (ref 39–117)
BUN: 17 mg/dL (ref 6–23)
CHLORIDE: 102 meq/L (ref 96–112)
CO2: 29 meq/L (ref 19–32)
Calcium: 9.5 mg/dL (ref 8.4–10.5)
Creatinine, Ser: 1.2 mg/dL (ref 0.4–1.5)
GFR: 68.73 mL/min (ref >60.00)
GLUCOSE: 104 mg/dL — AB (ref 70–99)
POTASSIUM: 4.3 meq/L (ref 3.5–5.1)
SODIUM: 139 meq/L (ref 135–145)
TOTAL PROTEIN: 7.3 g/dL (ref 6.0–8.3)
Total Bilirubin: 0.9 mg/dL (ref 0.2–1.2)

## 2014-08-16 LAB — CBC WITH DIFFERENTIAL/PLATELET
BASOS ABS: 0 10*3/uL (ref 0.0–0.1)
Basophils Relative: 0.6 % (ref 0.0–3.0)
EOS PCT: 1.7 % (ref 0.0–5.0)
Eosinophils Absolute: 0.1 10*3/uL (ref 0.0–0.7)
HCT: 44.2 % (ref 39.0–52.0)
Hemoglobin: 15.3 g/dL (ref 13.0–17.0)
LYMPHS PCT: 24.5 % (ref 12.0–46.0)
Lymphs Abs: 1.5 10*3/uL (ref 0.7–4.0)
MCHC: 34.5 g/dL (ref 30.0–36.0)
MCV: 85.3 fl (ref 78.0–100.0)
MONOS PCT: 8.9 % (ref 3.0–12.0)
Monocytes Absolute: 0.5 10*3/uL (ref 0.1–1.0)
NEUTROS PCT: 64.3 % (ref 43.0–77.0)
Neutro Abs: 3.9 10*3/uL (ref 1.4–7.7)
PLATELETS: 200 10*3/uL (ref 150.0–400.0)
RBC: 5.18 Mil/uL (ref 4.22–5.81)
RDW: 14 % (ref 11.5–15.5)
WBC: 6.1 10*3/uL (ref 4.0–10.5)

## 2014-08-16 LAB — SEDIMENTATION RATE: SED RATE: 19 mm/h (ref 0–22)

## 2014-09-04 ENCOUNTER — Encounter: Payer: Self-pay | Admitting: Internal Medicine

## 2014-11-16 ENCOUNTER — Other Ambulatory Visit: Payer: Self-pay | Admitting: Internal Medicine

## 2016-01-04 ENCOUNTER — Telehealth: Payer: Self-pay | Admitting: Internal Medicine

## 2016-01-04 MED ORDER — OSELTAMIVIR PHOSPHATE 75 MG PO CAPS: 75.0000 mg | ORAL_CAPSULE | Freq: Every day | ORAL | Status: DC

## 2016-01-04 NOTE — Telephone Encounter (Signed)
Discussed pt with Dr.Hunter, okay to order Tamiflu 75 mg one tablet daily x 10 days.  Spoke to pt, told him Dr.K is out but Dr.Hunter gave order for Tamiflu 75 mg one tablet daily x 10 days. Rx sent to pharmacy. Pt verbalized understanding.

## 2016-01-04 NOTE — Telephone Encounter (Signed)
Pt states he came in from out of town and his wife and 2 kids have the flu. Pt states he is flying out again Tuesday and his children's pediatrician suggest he call his md and get   T[place] flu [facility]  Advised pt he needs to schedule a cpx.  Pt states he travels with his job,  but will let me know when he can schedule a visit with Dr Raliegh Ip  Pt has not seen dr Raliegh Ip since 06/2014

## 2016-01-21 ENCOUNTER — Emergency Department (HOSPITAL_BASED_OUTPATIENT_CLINIC_OR_DEPARTMENT_OTHER): Payer: 59

## 2016-01-21 ENCOUNTER — Emergency Department (HOSPITAL_BASED_OUTPATIENT_CLINIC_OR_DEPARTMENT_OTHER)
Admission: EM | Admit: 2016-01-21 | Discharge: 2016-01-21 | Disposition: A | Discharge status: Home / Self Care | Payer: 59 | Attending: Emergency Medicine | Admitting: Emergency Medicine

## 2016-01-21 ENCOUNTER — Encounter (HOSPITAL_BASED_OUTPATIENT_CLINIC_OR_DEPARTMENT_OTHER): Payer: Self-pay | Admitting: Emergency Medicine

## 2016-01-21 DIAGNOSIS — E669 Obesity, unspecified: Secondary | ICD-10-CM | POA: Insufficient documentation

## 2016-01-21 DIAGNOSIS — Z87891 Personal history of nicotine dependence: Secondary | ICD-10-CM | POA: Diagnosis not present

## 2016-01-21 DIAGNOSIS — H6591 Unspecified nonsuppurative otitis media, right ear: Secondary | ICD-10-CM | POA: Diagnosis not present

## 2016-01-21 DIAGNOSIS — Z87442 Personal history of urinary calculi: Secondary | ICD-10-CM | POA: Insufficient documentation

## 2016-01-21 DIAGNOSIS — J45909 Unspecified asthma, uncomplicated: Secondary | ICD-10-CM | POA: Insufficient documentation

## 2016-01-21 DIAGNOSIS — Z7951 Long term (current) use of inhaled steroids: Secondary | ICD-10-CM | POA: Insufficient documentation

## 2016-01-21 DIAGNOSIS — I159 Secondary hypertension, unspecified: Secondary | ICD-10-CM

## 2016-01-21 DIAGNOSIS — H6691 Otitis media, unspecified, right ear: Secondary | ICD-10-CM

## 2016-01-21 DIAGNOSIS — Z8639 Personal history of other endocrine, nutritional and metabolic disease: Secondary | ICD-10-CM | POA: Diagnosis not present

## 2016-01-21 DIAGNOSIS — R51 Headache: Secondary | ICD-10-CM | POA: Insufficient documentation

## 2016-01-21 DIAGNOSIS — Z79899 Other long term (current) drug therapy: Secondary | ICD-10-CM | POA: Diagnosis not present

## 2016-01-21 DIAGNOSIS — R519 Headache, unspecified: Secondary | ICD-10-CM

## 2016-01-21 LAB — URINALYSIS, ROUTINE W REFLEX MICROSCOPIC
Bilirubin Urine: NEGATIVE
Glucose, UA: NEGATIVE mg/dL
Hgb urine dipstick: NEGATIVE
Ketones, ur: NEGATIVE mg/dL
Leukocytes, UA: NEGATIVE
Nitrite: NEGATIVE
Protein, ur: NEGATIVE mg/dL
Specific Gravity, Urine: 1.017 (ref 1.005–1.030)
pH: 5.5 (ref 5.0–8.0)

## 2016-01-21 LAB — CBC WITH DIFFERENTIAL/PLATELET
Basophils Absolute: 0.1 K/uL (ref 0.0–0.1)
Basophils Relative: 1 %
Eosinophils Absolute: 0.1 K/uL (ref 0.0–0.7)
Eosinophils Relative: 2 %
HCT: 43.8 % (ref 39.0–52.0)
Hemoglobin: 15.3 g/dL (ref 13.0–17.0)
Lymphocytes Relative: 30 %
Lymphs Abs: 1.8 K/uL (ref 0.7–4.0)
MCH: 29.9 pg (ref 26.0–34.0)
MCHC: 34.9 g/dL (ref 30.0–36.0)
MCV: 85.7 fL (ref 78.0–100.0)
Monocytes Absolute: 0.6 K/uL (ref 0.1–1.0)
Monocytes Relative: 9 %
Neutro Abs: 3.6 K/uL (ref 1.7–7.7)
Neutrophils Relative %: 58 %
Platelets: 208 K/uL (ref 150–400)
RBC: 5.11 MIL/uL (ref 4.22–5.81)
RDW: 13.8 % (ref 11.5–15.5)
WBC: 6.1 K/uL (ref 4.0–10.5)

## 2016-01-21 LAB — TROPONIN I: Troponin I: <0.03 ng/mL

## 2016-01-21 LAB — BASIC METABOLIC PANEL WITH GFR
Anion gap: 7 (ref 5–15)
BUN: 14 mg/dL (ref 6–20)
CO2: 28 mmol/L (ref 22–32)
Calcium: 9.4 mg/dL (ref 8.9–10.3)
Chloride: 104 mmol/L (ref 101–111)
Creatinine, Ser: 1.08 mg/dL (ref 0.61–1.24)
GFR calc Af Amer: >60 mL/min
GFR calc non Af Amer: >60 mL/min
Glucose, Bld: 102 mg/dL — ABNORMAL HIGH (ref 65–99)
Potassium: 4 mmol/L (ref 3.5–5.1)
Sodium: 139 mmol/L (ref 135–145)

## 2016-01-21 MED ORDER — SODIUM CHLORIDE 0.9 % IV BOLUS (SEPSIS)
500.0000 mL | Freq: Once | INTRAVENOUS | Status: AC
  Administered 2016-01-21: 500 mL via INTRAVENOUS

## 2016-01-21 MED ORDER — AMOXICILLIN 500 MG PO CAPS: 500.0000 mg | ORAL_CAPSULE | Freq: Two times a day (BID) | ORAL | Status: DC

## 2016-01-21 MED ORDER — KETOROLAC TROMETHAMINE 30 MG/ML IJ SOLN
30.0000 mg | Freq: Once | INTRAMUSCULAR | Status: AC
  Administered 2016-01-21: 30 mg via INTRAVENOUS
  Filled 2016-01-21: qty 1

## 2016-01-21 MED ORDER — DIPHENHYDRAMINE HCL 50 MG/ML IJ SOLN
12.5000 mg | Freq: Once | INTRAMUSCULAR | Status: AC
  Administered 2016-01-21: 12.5 mg via INTRAVENOUS
  Filled 2016-01-21: qty 1

## 2016-01-21 MED ORDER — METOCLOPRAMIDE HCL 5 MG/ML IJ SOLN
10.0000 mg | Freq: Once | INTRAMUSCULAR | Status: AC
  Administered 2016-01-21: 10 mg via INTRAVENOUS
  Filled 2016-01-21: qty 2

## 2016-01-21 MED ORDER — DEXAMETHASONE SODIUM PHOSPHATE 10 MG/ML IJ SOLN
10.0000 mg | Freq: Once | INTRAMUSCULAR | Status: AC
  Administered 2016-01-21: 10 mg via INTRAVENOUS
  Filled 2016-01-21: qty 1

## 2016-01-21 NOTE — Discharge Instructions (Signed)
General Headache Without Cause °A headache is pain or discomfort felt around the head or neck area. There are many causes and types of headaches. In some cases, the cause may not be found.  °HOME CARE  °Managing Pain °· Take over-the-counter and prescription medicines only as told by your doctor. °· Lie down in a dark, quiet room when you have a headache. °· If directed, apply ice to the head and neck area: °· Put ice in a plastic bag. °· Place a towel between your skin and the bag. °· Leave the ice on for 20 minutes, 2-3 times per day. °· Use a heating pad or hot shower to apply heat to the head and neck area as told by your doctor. °· Keep lights dim if bright lights bother you or make your headaches worse. °Eating and Drinking °· Eat meals on a regular schedule. °· Lessen how much alcohol you drink. °· Lessen how much caffeine you drink, or stop drinking caffeine. °General Instructions °· Keep all follow-up visits as told by your doctor. This is important. °· Keep a journal to find out if certain things bring on headaches. For example, write down: °· What you eat and drink. °· How much sleep you get. °· Any change to your diet or medicines. °· Relax by getting a massage or doing other relaxing activities. °· Lessen stress. °· Sit up straight. Do not tighten (tense) your muscles. °· Do not use tobacco products. This includes cigarettes, chewing tobacco, or e-cigarettes. If you need help quitting, ask your doctor. °· Exercise regularly as told by your doctor. °· Get enough sleep. This often means 7-9 hours of sleep. °GET HELP IF: °· Your symptoms are not helped by medicine. °· You have a headache that feels different than the other headaches. °· You feel sick to your stomach (nauseous) or you throw up (vomit). °· You have a fever. °GET HELP RIGHT AWAY IF:  °· Your headache becomes really bad. °· You keep throwing up. °· You have a stiff neck. °· You have trouble seeing. °· You have trouble speaking. °· You have  pain in the eye or ear. °· Your muscles are weak or you lose muscle control. °· You lose your balance or have trouble walking. °· You feel like you will pass out (faint) or you pass out. °· You have confusion. °  °This information is not intended to replace advice given to you by your health care provider. Make sure you discuss any questions you have with your health care provider. °  °Document Released: 07/15/2008 Document Revised: 06/27/2015 Document Reviewed: 01/29/2015 °Elsevier Interactive Patient Education ©2016 Elsevier [employer]. ° °Hypertension °Hypertension, commonly called high blood pressure, is when the force of blood pumping through your arteries is too strong. Your arteries are the blood vessels that carry blood from your heart throughout your body. A blood pressure reading consists of a higher number over a lower number, such as 110/72. The higher number (systolic) is the pressure inside your arteries when your heart pumps. The lower number (diastolic) is the pressure inside your arteries when your heart relaxes. Ideally you want your blood pressure below 120/80. °Hypertension forces your heart to work harder to pump blood. Your arteries may become narrow or stiff. Having untreated or uncontrolled hypertension can cause heart attack, stroke, kidney disease, and other problems. °RISK FACTORS °Some risk factors for high blood pressure are controllable. Others are not.  °Risk factors you cannot control include:  °· Race. You may   be at higher risk if you are African American. °· Age. Risk increases with age. °· Gender. Men are at higher risk than women before age 45 years. After age 65, women are at higher risk than men. °Risk factors you can control include: °· Not getting enough exercise or physical activity. °· Being overweight. °· Getting too much fat, sugar, calories, or salt in your diet. °· Drinking too much alcohol. °SIGNS AND SYMPTOMS °Hypertension does not usually cause signs or symptoms. Extremely  high blood pressure (hypertensive crisis) may cause headache, anxiety, shortness of breath, and nosebleed. °DIAGNOSIS °To check if you have hypertension, your health care provider will measure your blood pressure while you are seated, with your arm held at the level of your heart. It should be measured at least twice using the same arm. Certain conditions can cause a difference in blood pressure between your right and left arms. A blood pressure reading that is higher than normal on one occasion does not mean that you need treatment. If it is not clear whether you have high blood pressure, you may be asked to return on a different day to have your blood pressure checked again. Or, you may be asked to monitor your blood pressure at home for 1 or more weeks. °TREATMENT °Treating high blood pressure includes making lifestyle changes and possibly taking medicine. Living a healthy lifestyle can help lower high blood pressure. You may need to change some of your habits. °Lifestyle changes may include: °· Following the DASH diet. This diet is high in fruits, vegetables, and whole grains. It is low in salt, red meat, and added sugars. °· Keep your sodium intake below 2,300 mg per day. °· Getting at least 30-45 minutes of aerobic exercise at least 4 times per week. °· Losing weight if necessary. °· Not smoking. °· Limiting alcoholic beverages. °· Learning ways to reduce stress. °Your health care provider may prescribe medicine if lifestyle changes are not enough to get your blood pressure under control, and if one of the following is true: °· You are 18-59 years of age and your systolic blood pressure is above 140. °· You are 60 years of age or older, and your systolic blood pressure is above 150. °· Your diastolic blood pressure is above 90. °· You have diabetes, and your systolic blood pressure is over 140 or your diastolic blood pressure is over 90. °· You have kidney disease and your blood pressure is above  140/90. °· You have heart disease and your blood pressure is above 140/90. °Your personal target blood pressure may vary depending on your medical conditions, your age, and other factors. °HOME CARE INSTRUCTIONS °· Have your blood pressure rechecked as directed by your health care provider.   °· Take medicines only as directed by your health care provider. Follow the directions carefully. Blood pressure medicines must be taken as prescribed. The medicine does not work as well when you skip doses. Skipping doses also puts you at risk for problems. °· Do not smoke.   °· Monitor your blood pressure at home as directed by your health care provider.  °SEEK MEDICAL CARE IF:  °· You think you are having a reaction to medicines taken. °· You have recurrent headaches or feel dizzy. °· You have swelling in your ankles. °· You have trouble with your vision. °SEEK IMMEDIATE MEDICAL CARE IF: °· You develop a severe headache or confusion. °· You have unusual weakness, numbness, or feel faint. °· You have severe chest or abdominal pain. °·   You vomit repeatedly.  You have trouble breathing. MAKE SURE YOU:   Understand these instructions.  Will watch your condition.  Will get help right away if you are not doing well or get worse.   This information is not intended to replace advice given to you by your health care provider. Make sure you discuss any questions you have with your health care provider.   Document Released: 10/06/2005 Document Revised: 02/20/2015 Document Reviewed: 07/29/2013 Elsevier Interactive Patient Education 2016 Elsevier Inc.  Otitis Media, Adult Otitis media is redness, soreness, and inflammation of the middle ear. Otitis media may be caused by allergies or, most commonly, by infection. Often it occurs as a complication of the common cold. SIGNS AND SYMPTOMS Symptoms of otitis media may include:  Earache.  Fever.  Ringing in your ear.  Headache.  Leakage of fluid from the  ear. DIAGNOSIS To diagnose otitis media, your health care provider will examine your ear with an otoscope. This is an instrument that allows your health care provider to see into your ear in order to examine your eardrum. Your health care provider also will ask you questions about your symptoms. TREATMENT  Typically, otitis media resolves on its own within 3-5 days. Your health care provider may prescribe medicine to ease your symptoms of pain. If otitis media does not resolve within 5 days or is recurrent, your health care provider may prescribe antibiotic medicines if he or she suspects that a bacterial infection is the cause. HOME CARE INSTRUCTIONS   If you were prescribed an antibiotic medicine, finish it all even if you start to feel better.  Take medicines only as directed by your health care provider.  Keep all follow-up visits as directed by your health care provider. SEEK MEDICAL CARE IF:  You have otitis media only in one ear, or bleeding from your nose, or both.  You notice a lump on your neck.  You are not getting better in 3-5 days.  You feel worse instead of better. SEEK IMMEDIATE MEDICAL CARE IF:   You have pain that is not controlled with medicine.  You have swelling, redness, or pain around your ear or stiffness in your neck.  You notice that part of your face is paralyzed.  You notice that the bone behind your ear (mastoid) is tender when you touch it. MAKE SURE YOU:   Understand these instructions.  Will watch your condition.  Will get help right away if you are not doing well or get worse.   This information is not intended to replace advice given to you by your health care provider. Make sure you discuss any questions you have with your health care provider.   Take antibiotics as prescribed. Take Zyrtec daily for sinuses. Avoid using Sudafed in the future. Return to the emergency room if you experience severe worsening of your symptoms, chest pain, severe  headache, blurry vision, vomiting, dizziness, loss of consciousness, difficulty breathing.

## 2016-01-21 NOTE — ED Provider Notes (Signed)
CSN: [id number]     Arrival date & time 01/21/16  1745 History   First MD Initiated Contact with Patient 01/21/16 1918     Chief Complaint  Patient presents with  . Headache     (Consider location/radiation/quality/duration/timing/severity/associated sxs/prior Treatment) HPI   Jason Benitez is a 45 year old male with past medical history of HLD who presents to the ED complaining of headache and hypertension. Patient states that for the last 2-3 days he has had severe headache located behind his eyes. He has been taking Sudafed and Tylenol every 4 hours without relief. [facility] to be seen for the symptoms. Patient states while he was there his heart rate was over 200 and his blood pressure was elevated. Patient denied any symptoms of chest pain, palpitations, shortness of breath or dizziness. The provider at the [facility] sent him to the ER for further evaluation. Patient states that he has a history of borderline hypertension but does not take any blood pressure medications. He denies blurry vision, neck pain, stiffness, vomiting, paresthesias, weakness.  Past Medical History  Diagnosis Date  . ALLERGIC RHINITIS 05/03/2007  . ASTHMA 05/03/2007  . DYSLIPIDEMIA 05/03/2007  . EXOGENOUS OBESITY 05/03/2007  . LOW BACK PAIN 03/30/2008  . NEPHROLITHIASIS, HX OF 03/30/2008   History reviewed. No pertinent past surgical history. Family History  Problem Relation Age of Onset  . Diabetes Mother   . Hypertension Mother   . Diabetes Father   . Cancer Father     pancreatic  . Cancer Maternal Grandfather     lung ca   Social History  Substance Use Topics  . Smoking status: Former Smoker    Quit date: 10/20/1990  . Smokeless tobacco: Never Used  . Alcohol Use: Yes    Review of Systems  All other systems reviewed and are negative.     Allergies  Review of patient's allergies indicates no known allergies.  Home Medications   Prior to  Admission medications   Medication Sig Start Date End Date Taking? Authorizing Provider  buPROPion (WELLBUTRIN SR) 150 MG 12 hr tablet Take 1 tablet (150 mg total) by mouth 2 (two) times daily. 08/11/14   Marletta Lor, MD  fluticasone [facility]) 50 MCG/ACT nasal spray Place 2 sprays into the nose daily. 03/15/13   Marletta Lor, MD  omeprazole (PRILOSEC) 20 MG capsule Take 1 capsule (20 mg total) by mouth daily. 07/19/14   Marletta Lor, MD  oseltamivir (TAMIFLU) 75 MG capsule Take 1 capsule (75 mg total) by mouth daily. X 10 days. 01/04/16   Marin Olp, MD  PROAIR HFA 108 (90 BASE) MCG/ACT inhaler Inhale one or two puffs by mouth   every 6 hours as needed 11/16/14   Marletta Lor, MD   BP 153/102 mmHg  Pulse 112  Resp 16  Ht 5\' 10"  (1.778 m)  Wt 113.399 kg  BMI 35.87 kg/m2  SpO2 97% Physical Exam  Constitutional: He is oriented to person, place, and time. He appears well-developed and well-nourished. No distress.  HENT:  Head: Normocephalic and atraumatic.  Right Ear: Tympanic membrane is erythematous. A middle ear effusion is present.  Left Ear: Tympanic membrane normal.  Mouth/Throat: No oropharyngeal exudate.  Eyes: Conjunctivae and EOM are normal. Pupils are equal, round, and reactive to light. Right eye exhibits no discharge. Left eye exhibits no discharge. No scleral icterus.  Neck: Neck supple. No JVD present.  Cardiovascular: Normal rate, regular rhythm,  normal heart sounds and intact distal pulses.  Exam reveals no gallop and no friction rub.   No murmur heard. Pulmonary/Chest: Effort normal and breath sounds normal. No respiratory distress. He has no wheezes. He has no rales. He exhibits no tenderness.  Abdominal: Soft. He exhibits no distension. There is no tenderness. There is no guarding.  Musculoskeletal: Normal range of motion. He exhibits no edema.  Neurological: He is alert and oriented to person, place, and time. No cranial nerve deficit.   Strength 5/5 throughout. No sensory deficits.  No gait abnormality. No dysmetria.   Skin: Skin is warm and dry. No rash noted. He is not diaphoretic. No erythema. No pallor.  Psychiatric: He has a normal mood and affect. His behavior is normal.  Nursing note and vitals reviewed.   ED Course  Procedures (including critical care time) Labs Review Labs Reviewed  BASIC METABOLIC PANEL - Abnormal; Notable for the following:    Glucose, Bld 102 (*)    All other components within normal limits  CBC WITH DIFFERENTIAL/PLATELET  URINALYSIS, ROUTINE W REFLEX MICROSCOPIC (NOT AT [facility])  TROPONIN I    Imaging Review Ct Head Wo Contrast  01/21/2016  CLINICAL DATA:  Headache for 6 months. EXAM: CT HEAD WITHOUT CONTRAST TECHNIQUE: Contiguous axial images were obtained from the base of the skull through the vertex without intravenous contrast. COMPARISON:  None. FINDINGS: Brain: No evidence of acute infarction, hemorrhage, extra-axial collection, ventriculomegaly, or mass effect. Vascular: No hyperdense vessel or unexpected calcification. Skull: Negative for fracture or focal lesion. Sinuses/Orbits: No acute findings. No mucosal thickening or fluid level. Mastoids are clear. Other: None. IMPRESSION: No acute intracranial abnormality. Electronically Signed   By: Jeb Levering M.D.   On: 01/21/2016 22:27   I have personally reviewed and evaluated these images and lab results as part of my medical decision-making.   EKG Interpretation   Date/Time:  Monday January 21 2016 20:28:40 EDT Ventricular Rate:  85 PR Interval:  148 QRS Duration: 95 QT Interval:  339 QTC Calculation: 403 R Axis:   52 Text Interpretation:  Sinus rhythm ED PHYSICIAN INTERPRETATION AVAILABLE  IN [facility] Confirmed by TEST, Record ([id number]) on 01/22/2016 6:51:23  AM      MDM   Final diagnoses:  Acute right otitis media, recurrence not specified, unspecified otitis media type  Acute intractable headache, unspecified  headache type  Secondary hypertension, unspecified   45 y.o y.o M with hx of HLD presents to the ED to be evaluation for HA. [facility] pt became tachycardic and hypertensive. No associated CP, diaphoresis or SOB. Pt has been taking sudafed multiple times daily as felt his headache was due to a sinus infection. He is not currently on blood pressure medication. On presentation to the ED, pt appears well, in NAD. BP elevated at 153/102. No tachycardia. EKG reveals normal sinus rhythm. Initial troponin negative. Low suspicion ACS. CT head obtained as patient has been having intermittent headaches for 6 months which she has been treating with Sudafed and is normal. Patient does appear to have right otitis media with a middle ear effusion. We'll treat with amoxicillin. Patient will discontinue taking Sudafed as this is likely leading to rebound headaches and hypertension. All other blood work within normal limits. Patient may follow up with his primary care provider as an outpatient. Patient is hemodynamically stable and ready for discharge.Return precautions outlined in patient discharge instructions.   Patient was discussed with and seen by Dr. Johnney Killian  who agrees with the treatment plan.      Dondra Spry [place], PA-C 01/25/16 0002  Charlesetta Shanks, MD 01/25/16 [phone]

## 2016-01-21 NOTE — ED Notes (Signed)
[facility] states that [facility] because he was having a headache for the last 2 -3 days. He had a High HR at the clinic and HTN and was told to come here.

## 2017-02-06 DIAGNOSIS — E782 Mixed hyperlipidemia: Secondary | ICD-10-CM | POA: Diagnosis not present

## 2017-02-06 DIAGNOSIS — M109 Gout, unspecified: Secondary | ICD-10-CM | POA: Diagnosis not present

## 2017-02-06 DIAGNOSIS — I10 Essential (primary) hypertension: Secondary | ICD-10-CM | POA: Diagnosis not present

## 2017-02-13 ENCOUNTER — Other Ambulatory Visit: Payer: Self-pay | Admitting: Family Medicine

## 2017-02-13 DIAGNOSIS — R109 Unspecified abdominal pain: Secondary | ICD-10-CM

## 2017-02-13 DIAGNOSIS — M109 Gout, unspecified: Secondary | ICD-10-CM | POA: Diagnosis not present

## 2017-02-13 DIAGNOSIS — I10 Essential (primary) hypertension: Secondary | ICD-10-CM | POA: Diagnosis not present

## 2017-02-13 DIAGNOSIS — E782 Mixed hyperlipidemia: Secondary | ICD-10-CM | POA: Diagnosis not present

## 2017-02-20 ENCOUNTER — Ambulatory Visit
Admission: RE | Admit: 2017-02-20 | Discharge: 2017-02-20 | Disposition: A | Discharge status: Home / Self Care | Payer: 59 | Source: Ambulatory Visit | Attending: Family Medicine | Admitting: Family Medicine

## 2017-02-20 DIAGNOSIS — R109 Unspecified abdominal pain: Secondary | ICD-10-CM

## 2017-05-07 ENCOUNTER — Other Ambulatory Visit: Payer: Self-pay | Admitting: Family Medicine

## 2017-05-07 ENCOUNTER — Ambulatory Visit
Admission: RE | Admit: 2017-05-07 | Discharge: 2017-05-07 | Disposition: A | Discharge status: Home / Self Care | Payer: 59 | Source: Ambulatory Visit | Attending: Family Medicine | Admitting: Family Medicine

## 2017-05-07 DIAGNOSIS — M545 Low back pain: Secondary | ICD-10-CM | POA: Diagnosis not present

## 2017-05-07 DIAGNOSIS — R109 Unspecified abdominal pain: Secondary | ICD-10-CM

## 2017-05-07 DIAGNOSIS — N200 Calculus of kidney: Secondary | ICD-10-CM | POA: Diagnosis not present

## 2017-05-07 DIAGNOSIS — N211 Calculus in urethra: Secondary | ICD-10-CM | POA: Diagnosis not present

## 2017-05-12 ENCOUNTER — Encounter (HOSPITAL_COMMUNITY): Payer: Self-pay | Admitting: *Deleted

## 2017-05-12 ENCOUNTER — Other Ambulatory Visit: Payer: Self-pay | Admitting: Urology

## 2017-05-12 DIAGNOSIS — N201 Calculus of ureter: Secondary | ICD-10-CM | POA: Diagnosis not present

## 2017-05-12 DIAGNOSIS — N390 Urinary tract infection, site not specified: Secondary | ICD-10-CM | POA: Diagnosis not present

## 2017-05-12 DIAGNOSIS — R350 Frequency of micturition: Secondary | ICD-10-CM | POA: Diagnosis not present

## 2017-05-12 DIAGNOSIS — B951 Streptococcus, group B, as the cause of diseases classified elsewhere: Secondary | ICD-10-CM | POA: Diagnosis not present

## 2017-05-13 NOTE — H&P (Signed)
CC/HPI: I was consulted by the above provider to assess the patient's left lower quadrant and left flank pain. He was recently diagnosed with a stone and is been having pain off and on for 5 days. Sometimes the pain is minimal in the left groin and left lower quadrant and left back and other times more severe. He now is voiding every 20 minutes at times. He's not had any fever. He felt poorly today's ago.   His flow is reasonable. Normally he voids every 2 hours. He passed a kidney stone 20 years ago and does not normally get bladder infections   He has no neurologic issues. He describes now on Flomax and Toradol   On July 19 the patient had a CT scan. He had a very small nonobstructing stone in each kidney. He had a 54 mm stone just distal to the left ureteropelvic junction associated with mild to moderate hydronephrosis   There is no other aggravating or relieving factors  There is no other associated signs and symptoms  The severity of the symptoms is moderate  The symptoms are ongoing and bothersome      ALLERGIES: None   MEDICATIONS: Allopurinol 100 mg tablet  Hydrochlorothiazide  Ketorolac Tromethamine 10 mg tablet  Omeprazole  Tamsulosin Hcl 0.4 mg capsule, ext release 24 hr  Albuterol Sulfate  Claritin 10 mg tablet  Escitalopram Oxalate 20 mg tablet  Fenofibrate  Losartan Potassium  Montelukast Sodium 10 mg tablet  Proair Respiclick 90 mcg aerosol powder, breath activated  Rosuvastatin Calcium  Singulair 10 mg tablet     GU PSH: No GU PSH    NON-GU PSH: No Non-GU PSH    GU PMH: Urinary Calculus, Unspec    NON-GU PMH: Hypercholesterolemia Hypertension    FAMILY HISTORY: No Family History    SOCIAL HISTORY: Marital Status: Married Current Smoking Status: Patient does not smoke anymore. Has not smoked since 04/19/2002. Smoked for 15 years. Smoked 1/2 pack per day.   Tobacco Use Assessment Completed: Used Tobacco in last 30 days? Drinks 1 drink per day.  Drinks  3 caffeinated drinks per day. Patient's occupation Sales executive.    REVIEW OF SYSTEMS:    GU Review Male:   Patient denies frequent urination, hard to postpone urination, burning/ pain with urination, get up at night to urinate, leakage of urine, stream starts and stops, trouble starting your stream, have to strain to urinate , erection problems, and penile pain.  Gastrointestinal (Upper):   Patient denies nausea, vomiting, and indigestion/ heartburn.  Gastrointestinal (Lower):   Patient denies diarrhea and constipation.  Constitutional:   Patient denies fever, night sweats, weight loss, and fatigue.  Skin:   Patient denies skin rash/ lesion and itching.  Eyes:   Patient denies blurred vision and double vision.  Ears/ Nose/ Throat:   Patient denies sore throat and sinus problems.  Hematologic/Lymphatic:   Patient denies swollen glands and easy bruising.  Cardiovascular:   Patient denies leg swelling and chest pains.  Respiratory:   Patient denies cough and shortness of breath.  Endocrine:   Patient denies excessive thirst.  Musculoskeletal:   Patient reports back pain. Patient denies joint pain.  Neurological:   Patient denies headaches and dizziness.  Psychologic:   Patient denies depression and anxiety.   Notes: Groin Pain    VITAL SIGNS:      05/12/2017 01:55 PM  Weight 255 lb / 115.67 kg  Height 70 in / 177.8 cm  BP 139/99 mmHg  Pulse  94 /min  Temperature 97.8 F / 36.5 C  BMI 36.6 kg/m   GU PHYSICAL EXAMINATION:    Prostate: Nontoxic; minimal to no CVA tenderness. No abdominal tenderness. No epididymitis   MULTI-SYSTEM PHYSICAL EXAMINATION:    Constitutional: Well-nourished. No physical deformities. Normally developed. Good grooming.  Neck: Neck symmetrical, not swollen. Normal tracheal position.  Respiratory: No labored breathing, no use of accessory muscles.   Cardiovascular: Normal temperature, normal extremity pulses, no swelling, no varicosities.  Lymphatic: No  enlargement of neck, axillae, groin.  Skin: No paleness, no jaundice, no cyanosis. No lesion, no ulcer, no rash.  Neurologic / Psychiatric: Oriented to time, oriented to place, oriented to person. No depression, no anxiety, no agitation.  Gastrointestinal: No mass, no tenderness, no rigidity, non obese abdomen.  Eyes: Normal conjunctivae. Normal eyelids.  Ears, Nose, Mouth, and Throat: Left ear no scars, no lesions, no masses. Right ear no scars, no lesions, no masses. Nose no scars, no lesions, no masses. Normal hearing. Normal lips.  Musculoskeletal: Normal gait and station of head and neck.     PAST DATA REVIEWED:  Source Of History:  Patient   PROCEDURES:         KUB - [id number]  Renal shadows normal. No bony abnormalities. Calcification proximal left ureter almost arrow shaped and marked. No gas abnormalities               Urinalysis w/Scope Dipstick Dipstick Cont'd Micro  Color: Yellow Bilirubin: Neg WBC/hpf: 0 - 5/hpf  Appearance: Clear Ketones: Neg RBC/hpf: 10 - 20/hpf  Specific Gravity: 1.025 Blood: 3+ Bacteria: Rare (0-9/hpf)  pH: 6.0 Protein: Neg Cystals: NS (Not Seen)  Glucose: Neg Urobilinogen: 0.2 Casts: NS (Not Seen)    Nitrites: Neg Trichomonas: Not Present    Leukocyte Esterase: Neg Mucous: Not Present      Epithelial Cells: 0 - 5/hpf      Yeast: NS (Not Seen)      Sperm: Not Present    ASSESSMENT:      ICD-10 Details  1 GU:   Urinary Frequency - R35.0   2   Ureteral calculus - N20.1           Notes:   I was consulted by the above provider to assess the patient's left lower quadrant and left flank pain. He was recently diagnosed with a stone and is been having pain off and on for 5 days. Sometimes the pain is minimal in the left groin and left lower quadrant and left back and other times more severe. He now is voiding every 20 minutes at times. He's not had any fever. He felt poorly today's ago.   His flow is reasonable. Normally he voids every 2 hours. He passed  a kidney stone 20 years ago and does not normally get bladder infections   He has no neurologic issues. He describes now on Flomax and Toradol   On July 19 the patient had a CT scan. He had a very small nonobstructing stone in each kidney. He had a 54 mm stone just distal to the left ureteropelvic junction associated with mild to moderate hydronephrosis   Physical examination within normal limits as noted   Urinalysis rare bacteria at urine sent for culture.   I drew the patient a picture. We talked about passing the stone. He has not taken Toradol since 6:30 AM. I will give him hydrocodone 02/20/24 one to 2 tablets every 6 hours when necessary #40. He did not need nausea  medication. He would like to schedule lithotripsy for Thursday if he has not passed the stone. Indication go the emergency room with discussed. I also note the above provider       PLAN:            Medications New Meds: Hydrocodone-Acetaminophen 5 mg-325 mg tablet 1-2 tablet PO Q 6 H PRN   #40  0 Refill(s)            Orders Labs Urine Culture  X-Rays: KUB          Schedule Return Visit/Planned Activity: ASAP - Schedule Surgery          Document Letter(s):  Created for Patient: Clinical Summary

## 2017-05-14 ENCOUNTER — Ambulatory Visit (HOSPITAL_COMMUNITY): Payer: 59

## 2017-05-14 ENCOUNTER — Encounter (HOSPITAL_COMMUNITY): Payer: Self-pay | Admitting: *Deleted

## 2017-05-14 ENCOUNTER — Encounter (HOSPITAL_COMMUNITY)
Admission: RE | Disposition: A | Discharge status: Home / Self Care | Payer: Self-pay | Source: Ambulatory Visit | Attending: Urology

## 2017-05-14 ENCOUNTER — Ambulatory Visit (HOSPITAL_COMMUNITY)
Admission: RE | Admit: 2017-05-14 | Discharge: 2017-05-14 | Disposition: A | Discharge status: Home / Self Care | Payer: 59 | Source: Ambulatory Visit | Attending: Urology | Admitting: Urology

## 2017-05-14 DIAGNOSIS — N201 Calculus of ureter: Secondary | ICD-10-CM

## 2017-05-14 DIAGNOSIS — Z87891 Personal history of nicotine dependence: Secondary | ICD-10-CM | POA: Insufficient documentation

## 2017-05-14 DIAGNOSIS — Z01818 Encounter for other preprocedural examination: Secondary | ICD-10-CM | POA: Diagnosis not present

## 2017-05-14 DIAGNOSIS — N200 Calculus of kidney: Secondary | ICD-10-CM | POA: Diagnosis not present

## 2017-05-14 DIAGNOSIS — Z79899 Other long term (current) drug therapy: Secondary | ICD-10-CM | POA: Insufficient documentation

## 2017-05-14 HISTORY — DX: Essential (primary) hypertension: I10

## 2017-05-14 HISTORY — DX: Anxiety disorder, unspecified: F41.9

## 2017-05-14 HISTORY — PX: EXTRACORPOREAL SHOCK WAVE LITHOTRIPSY: SHX1557

## 2017-05-14 HISTORY — DX: Unspecified asthma, uncomplicated: J45.909

## 2017-05-14 HISTORY — DX: Personal history of urinary calculi: Z87.442

## 2017-05-14 SURGERY — LITHOTRIPSY, ESWL
Anesthesia: LOCAL | Laterality: Left

## 2017-05-14 MED ORDER — SODIUM CHLORIDE 0.9% FLUSH: 3.0000 mL | Freq: Two times a day (BID) | INTRAVENOUS | Status: DC

## 2017-05-14 MED ORDER — SODIUM CHLORIDE 0.9% FLUSH: 3.0000 mL | INTRAVENOUS | Status: DC | PRN

## 2017-05-14 MED ORDER — SODIUM CHLORIDE 0.9 % IV SOLN
INTRAVENOUS | Status: DC
  Administered 2017-05-14: 10:00:00 via INTRAVENOUS

## 2017-05-14 MED ORDER — OXYCODONE HCL 5 MG PO TABS: 5.0000 mg | ORAL_TABLET | ORAL | Status: DC | PRN

## 2017-05-14 MED ORDER — DIAZEPAM 5 MG PO TABS
10.0000 mg | ORAL_TABLET | ORAL | Status: AC
  Administered 2017-05-14: 10 mg via ORAL
  Filled 2017-05-14: qty 2

## 2017-05-14 MED ORDER — FENTANYL CITRATE (PF) 100 MCG/2ML IJ SOLN: 25.0000 ug | INTRAMUSCULAR | Status: DC | PRN

## 2017-05-14 MED ORDER — ACETAMINOPHEN 650 MG RE SUPP
650.0000 mg | RECTAL | Status: DC | PRN
  Filled 2017-05-14: qty 1

## 2017-05-14 MED ORDER — ONDANSETRON HCL 4 MG PO TABS: 4.0000 mg | ORAL_TABLET | Freq: Three times a day (TID) | ORAL | 1 refills | Status: AC | PRN

## 2017-05-14 MED ORDER — ACETAMINOPHEN 325 MG PO TABS
650.0000 mg | ORAL_TABLET | ORAL | Status: DC | PRN
  Administered 2017-05-14: 650 mg via ORAL
  Filled 2017-05-14: qty 2

## 2017-05-14 MED ORDER — CIPROFLOXACIN HCL 500 MG PO TABS
500.0000 mg | ORAL_TABLET | ORAL | Status: AC
  Administered 2017-05-14: 500 mg via ORAL
  Filled 2017-05-14: qty 1

## 2017-05-14 MED ORDER — SODIUM CHLORIDE 0.9 % IV SOLN: 250.0000 mL | INTRAVENOUS | Status: DC | PRN

## 2017-05-14 MED ORDER — DIPHENHYDRAMINE HCL 25 MG PO CAPS
25.0000 mg | ORAL_CAPSULE | ORAL | Status: AC
  Administered 2017-05-14: 25 mg via ORAL
  Filled 2017-05-14: qty 1

## 2017-05-14 NOTE — Discharge Instructions (Addendum)
Lithotripsy, Care After °This sheet gives you information about how to care for yourself after your procedure. Your health care provider may also give you more specific instructions. If you have problems or questions, contact your health care provider. °What can I expect after the procedure? °After the procedure, it is common to have: °· Some blood in your urine. This should only last for a few days. °· Soreness in your back, sides, or upper abdomen for a few days. °· Blotches or bruises on your back where the pressure wave entered the skin. °· Pain, discomfort, or nausea when pieces (fragments) of the kidney stone move through the tube that carries urine from the kidney to the bladder (ureter). Stone fragments may pass soon after the procedure, but they may continue to pass for up to 4-8 weeks. °? If you have severe pain or nausea, contact your health care provider. This may be caused by a large stone that was not broken up, and this may mean that you need more treatment. °· Some pain or discomfort during urination. °· Some pain or discomfort in the lower abdomen or (in men) at the base of the penis. ° °Follow these instructions at home: °Medicines °· Take over-the-counter and prescription medicines only as told by your health care provider. °· If you were prescribed an antibiotic medicine, take it as told by your health care provider. Do not stop taking the antibiotic even if you start to feel better. °· Do not drive for 24 hours if you were given a medicine to help you relax (sedative). °· Do not drive or use heavy machinery while taking prescription pain medicine. °Eating and drinking °· Drink enough water and fluids to keep your urine clear or pale yellow. This helps any remaining pieces of the stone to pass. It can also help prevent new stones from forming. °· Eat plenty of fresh fruits and vegetables. °· Follow instructions from your health care provider about eating and drinking restrictions. You may be  instructed: °? To reduce how much salt (sodium) you eat or drink. Check ingredients and nutrition facts on packaged foods and beverages. °? To reduce how much meat you eat. °· Eat the recommended amount of calcium for your age and gender. Ask your health care provider how much calcium you should have. °General instructions °· Get plenty of rest. °· Most people can resume normal activities 1-2 days after the procedure. Ask your health care provider what activities are safe for you. °· If directed, strain all urine through the strainer that was provided by your health care provider. °? Keep all fragments for your health care provider to see. Any stones that are found may be sent to a medical lab for examination. The stone may be as small as a grain of salt. °· Keep all follow-up visits as told by your health care provider. This is important. °Contact a health care provider if: °· You have pain that is severe or does not get better with medicine. °· You have nausea that is severe or does not go away. °· You have blood in your urine longer than your health care provider told you to expect. °· You have more blood in your urine. °· You have pain during urination that does not go away. °· You urinate more frequently than usual and this does not go away. °· You develop a rash or any other possible signs of an allergic reaction. °Get help right away if: °· You have severe pain in   your back, sides, or upper abdomen.  You have severe pain while urinating.  Your urine is very dark red.  You have blood in your stool (feces).  You cannot pass any urine at all.  You feel a strong urge to urinate after emptying your bladder.  You have a fever or chills.  You develop shortness of breath, difficulty breathing, or chest pain.  You have severe nausea that leads to persistent vomiting.  You faint. Summary  After this procedure, it is common to have some pain, discomfort, or nausea when pieces (fragments) of the  kidney stone move through the tube that carries urine from the kidney to the bladder (ureter). If this pain or nausea is severe, however, you should contact your health care provider.  Most people can resume normal activities 1-2 days after the procedure. Ask your health care provider what activities are safe for you.  Drink enough water and fluids to keep your urine clear or pale yellow. This helps any remaining pieces of the stone to pass, and it can help prevent new stones from forming.  If directed, strain your urine and keep all fragments for your health care provider to see. Fragments or stones may be as small as a grain of salt.  Get help right away if you have severe pain in your back, sides, or upper abdomen or have severe pain while urinating. This information is not intended to replace advice given to you by your health care provider. Make sure you discuss any questions you have with your health care provider. Document Released: 10/26/2007 Document Revised: 08/27/2016 Document Reviewed: 08/27/2016 Elsevier Interactive Patient Education  2017 [place].   Moderate Conscious Sedation, Adult, Care After These instructions provide you with information about caring for yourself after your procedure. Your health care provider may also give you more specific instructions. Your treatment has been planned according to current medical practices, but problems sometimes occur. Call your health care provider if you have any problems or questions after your procedure. What can I expect after the procedure? After your procedure, it is common:  To feel sleepy for several hours.  To feel clumsy and have poor balance for several hours.  To have poor judgment for several hours.  To vomit if you eat too soon.  Follow these instructions at home: For at least 24 hours after the procedure:   Do not: ? Participate in activities where you could fall or become injured. ? Drive. ? Use heavy  machinery. ? Drink alcohol. ? Take sleeping pills or medicines that cause drowsiness. ? Make important decisions or sign legal documents. ? Take care of children on your own.  Rest. Eating and drinking  Follow the diet recommended by your health care provider.  If you vomit: ? Drink water, juice, or soup when you can drink without vomiting. ? Make sure you have little or no nausea before eating solid foods. General instructions  Have a responsible adult stay with you until you are awake and alert.  Take over-the-counter and prescription medicines only as told by your health care provider.  If you smoke, do not smoke without supervision.  Keep all follow-up visits as told by your health care provider. This is important. Contact a health care provider if:  You keep feeling nauseous or you keep vomiting.  You feel light-headed.  You develop a rash.  You have a fever. Get help right away if:  You have trouble breathing. This information is not intended to  replace advice given to you by your health care provider. Make sure you discuss any questions you have with your health care provider. Document Released: 07/27/2013 Document Revised: 03/10/2016 Document Reviewed: 01/26/2016 Elsevier Interactive Patient Education  2018 [employer].    Dietary Guidelines to Help Prevent Kidney Stones Kidney stones are deposits of minerals and salts that form inside your kidneys. Your risk of developing kidney stones may be greater depending on your diet, your lifestyle, the medicines you take, and whether you have certain medical conditions. Most people can reduce their chances of developing kidney stones by following the instructions below. Depending on your overall health and the type of kidney stones you tend to develop, your dietitian may give you more specific instructions. What are tips for following this plan? Reading food labels  Choose foods with "no salt added" or "low-salt"  labels. Limit your sodium intake to less than 1500 mg per day.  Choose foods with calcium for each meal and snack. Try to eat about 300 mg of calcium at each meal. Foods that contain 200-500 mg of calcium per serving include: ? 8 oz (237 ml) of milk, fortified nondairy milk, and fortified fruit juice. ? 8 oz (237 ml) of kefir, yogurt, and soy yogurt. ? 4 oz (118 ml) of tofu. ? 1 oz of cheese. ? 1 cup (300 g) of dried figs. ? 1 cup (91 g) of cooked broccoli. ? 1-3 oz can of sardines or mackerel.  Most people need 1000 to 1500 mg of calcium each day. Talk to your dietitian about how much calcium is recommended for you. Shopping  Buy plenty of fresh fruits and vegetables. Most people do not need to avoid fruits and vegetables, even if they contain nutrients that may contribute to kidney stones.  When shopping for convenience foods, choose: ? Whole pieces of fruit. ? Premade salads with dressing on the side. ? Low-fat fruit and yogurt smoothies.  Avoid buying frozen meals or prepared deli foods.  Look for foods with live cultures, such as yogurt and kefir. Cooking  Do not add salt to food when cooking. Place a salt shaker on the table and allow each person to add his or her own salt to taste.  Use vegetable protein, such as beans, textured vegetable protein (TVP), or tofu instead of meat in pasta, casseroles, and soups. Meal planning  Eat less salt, if told by your dietitian. To do this: ? Avoid eating processed or premade food. ? Avoid eating fast food.  Eat less animal protein, including cheese, meat, poultry, or fish, if told by your dietitian. To do this: ? Limit the number of times you have meat, poultry, fish, or cheese each week. Eat a diet free of meat at least 2 days a week. ? Eat only one serving each day of meat, poultry, fish, or seafood. ? When you prepare animal protein, cut pieces into small portion sizes. For most meat and fish, one serving is about the size of one  deck of cards.  Eat at least 5 servings of fresh fruits and vegetables each day. To do this: ? Keep fruits and vegetables on hand for snacks. ? Eat 1 piece of fruit or a handful of berries with breakfast. ? Have a salad and fruit at lunch. ? Have two kinds of vegetables at dinner.  Limit foods that are high in a substance called oxalate. These include: ? Spinach. ? Rhubarb. ? Beets. ? Potato chips and french fries. ? Nuts.  If  you regularly take a diuretic medicine, make sure to eat at least 1-2 fruits or vegetables high in potassium each day. These include: ? Avocado. ? Banana. ? Orange, prune, carrot, or tomato juice. ? Baked potato. ? Cabbage. ? Beans and split peas. General instructions  Drink enough fluid to keep your urine clear or pale yellow. This is the most important thing you can do.  Talk to your health care provider and dietitian about taking daily supplements. Depending on your health and the cause of your kidney stones, you may be advised: ? Not to take supplements with vitamin C. ? To take a calcium supplement. ? To take a daily probiotic supplement. ? To take other supplements such as magnesium, fish oil, or vitamin B6.  Take all medicines and supplements as told by your health care provider.  Limit alcohol intake to no more than 1 drink a day for nonpregnant women and 2 drinks a day for men. One drink equals 12 oz of beer, 5 oz of wine, or 1 oz of hard liquor.  Lose weight if told by your health care provider. Work with your dietitian to find strategies and an eating plan that works best for you. What foods are not recommended? Limit your intake of the following foods, or as told by your dietitian. Talk to your dietitian about specific foods you should avoid based on the type of kidney stones and your overall health. Grains Breads. Bagels. Rolls. Baked goods. Salted crackers. Cereal. Pasta. Vegetables Spinach. Rhubarb. Beets. Canned vegetables. Angie Fava.  Olives. Meats and other protein foods Nuts. Nut butters. Large portions of meat, poultry, or fish. Salted or cured meats. Deli meats. Hot dogs. Sausages. Dairy Cheese. Beverages Regular soft drinks. Regular vegetable juice. Seasonings and other foods Seasoning blends with salt. Salad dressings. Canned soups. Soy sauce. Ketchup. Barbecue sauce. Canned pasta sauce. Casseroles. Pizza. Lasagna. Frozen meals. Potato chips. [place] fries. Summary  You can reduce your risk of kidney stones by making changes to your diet.  The most important thing you can do is drink enough fluid. You should drink enough fluid to keep your urine clear or pale yellow.  Ask your health care provider or dietitian how much protein from animal sources you should eat each day, and also how much salt and calcium you should have each day. This information is not intended to replace advice given to you by your health care provider. Make sure you discuss any questions you have with your health care provider. Document Released: 01/31/2011 Document Revised: 09/16/2016 Document Reviewed: 09/16/2016 Elsevier Interactive Patient Education  2017 [employer].

## 2017-05-14 NOTE — Interval H&P Note (Signed)
History and Physical Interval Note:  No change in left ureteral stone.   05/14/2017 10:45 AM  Jason Benitez  has presented today for surgery, with the diagnosis of LEFT URTERAL STONE  The various methods of treatment have been discussed with the patient and family. After consideration of risks, benefits and other options for treatment, the patient has consented to  Procedure(s): EXTRACORPOREAL SHOCK WAVE LITHOTRIPSY (ESWL) (Left) as a surgical intervention .  The patient's history has been reviewed, patient examined, no change in status, stable for surgery.  I have reviewed the patient's chart and labs.  Questions were answered to the patient's satisfaction.     Derrious Bologna J

## 2017-05-26 DIAGNOSIS — R8271 Bacteriuria: Secondary | ICD-10-CM | POA: Diagnosis not present

## 2017-05-26 DIAGNOSIS — N201 Calculus of ureter: Secondary | ICD-10-CM | POA: Diagnosis not present

## 2017-08-03 DIAGNOSIS — Z23 Encounter for immunization: Secondary | ICD-10-CM | POA: Diagnosis not present

## 2017-08-03 DIAGNOSIS — I10 Essential (primary) hypertension: Secondary | ICD-10-CM | POA: Diagnosis not present

## 2017-08-03 DIAGNOSIS — J45909 Unspecified asthma, uncomplicated: Secondary | ICD-10-CM | POA: Diagnosis not present

## 2017-11-03 DIAGNOSIS — Z0000 Encounter for general adult medical examination without abnormal findings: Secondary | ICD-10-CM | POA: Diagnosis not present

## 2017-11-03 DIAGNOSIS — Z125 Encounter for screening for malignant neoplasm of prostate: Secondary | ICD-10-CM | POA: Diagnosis not present

## 2017-11-23 DIAGNOSIS — Z0000 Encounter for general adult medical examination without abnormal findings: Secondary | ICD-10-CM | POA: Diagnosis not present

## 2017-11-23 DIAGNOSIS — I10 Essential (primary) hypertension: Secondary | ICD-10-CM | POA: Diagnosis not present

## 2017-11-23 DIAGNOSIS — E782 Mixed hyperlipidemia: Secondary | ICD-10-CM | POA: Diagnosis not present

## 2017-11-23 DIAGNOSIS — S4991XD Unspecified injury of right shoulder and upper arm, subsequent encounter: Secondary | ICD-10-CM | POA: Diagnosis not present

## 2017-12-25 DIAGNOSIS — I10 Essential (primary) hypertension: Secondary | ICD-10-CM | POA: Diagnosis not present

## 2017-12-25 DIAGNOSIS — E782 Mixed hyperlipidemia: Secondary | ICD-10-CM | POA: Diagnosis not present

## 2017-12-28 DIAGNOSIS — I10 Essential (primary) hypertension: Secondary | ICD-10-CM | POA: Diagnosis not present

## 2017-12-28 DIAGNOSIS — E782 Mixed hyperlipidemia: Secondary | ICD-10-CM | POA: Diagnosis not present

## 2018-02-17 DIAGNOSIS — D229 Melanocytic nevi, unspecified: Secondary | ICD-10-CM | POA: Diagnosis not present

## 2018-03-26 DIAGNOSIS — E782 Mixed hyperlipidemia: Secondary | ICD-10-CM | POA: Diagnosis not present

## 2018-04-09 DIAGNOSIS — E782 Mixed hyperlipidemia: Secondary | ICD-10-CM | POA: Diagnosis not present

## 2018-04-09 DIAGNOSIS — M109 Gout, unspecified: Secondary | ICD-10-CM | POA: Diagnosis not present

## 2018-04-09 DIAGNOSIS — I10 Essential (primary) hypertension: Secondary | ICD-10-CM | POA: Diagnosis not present

## 2018-05-07 DIAGNOSIS — E782 Mixed hyperlipidemia: Secondary | ICD-10-CM | POA: Diagnosis not present

## 2018-05-07 DIAGNOSIS — G2581 Restless legs syndrome: Secondary | ICD-10-CM | POA: Diagnosis not present

## 2018-05-07 DIAGNOSIS — I10 Essential (primary) hypertension: Secondary | ICD-10-CM | POA: Diagnosis not present

## 2018-05-10 IMAGING — CR DG ABDOMEN 1V
2 series · 2 of 2 positions shown · non-contrast
Comparison: CT 05/07/2017

CLINICAL DATA: Preop left ureteral stone

EXAM:
ABDOMEN - 1 VIEW

[t abdomen supine (1 of 2)]
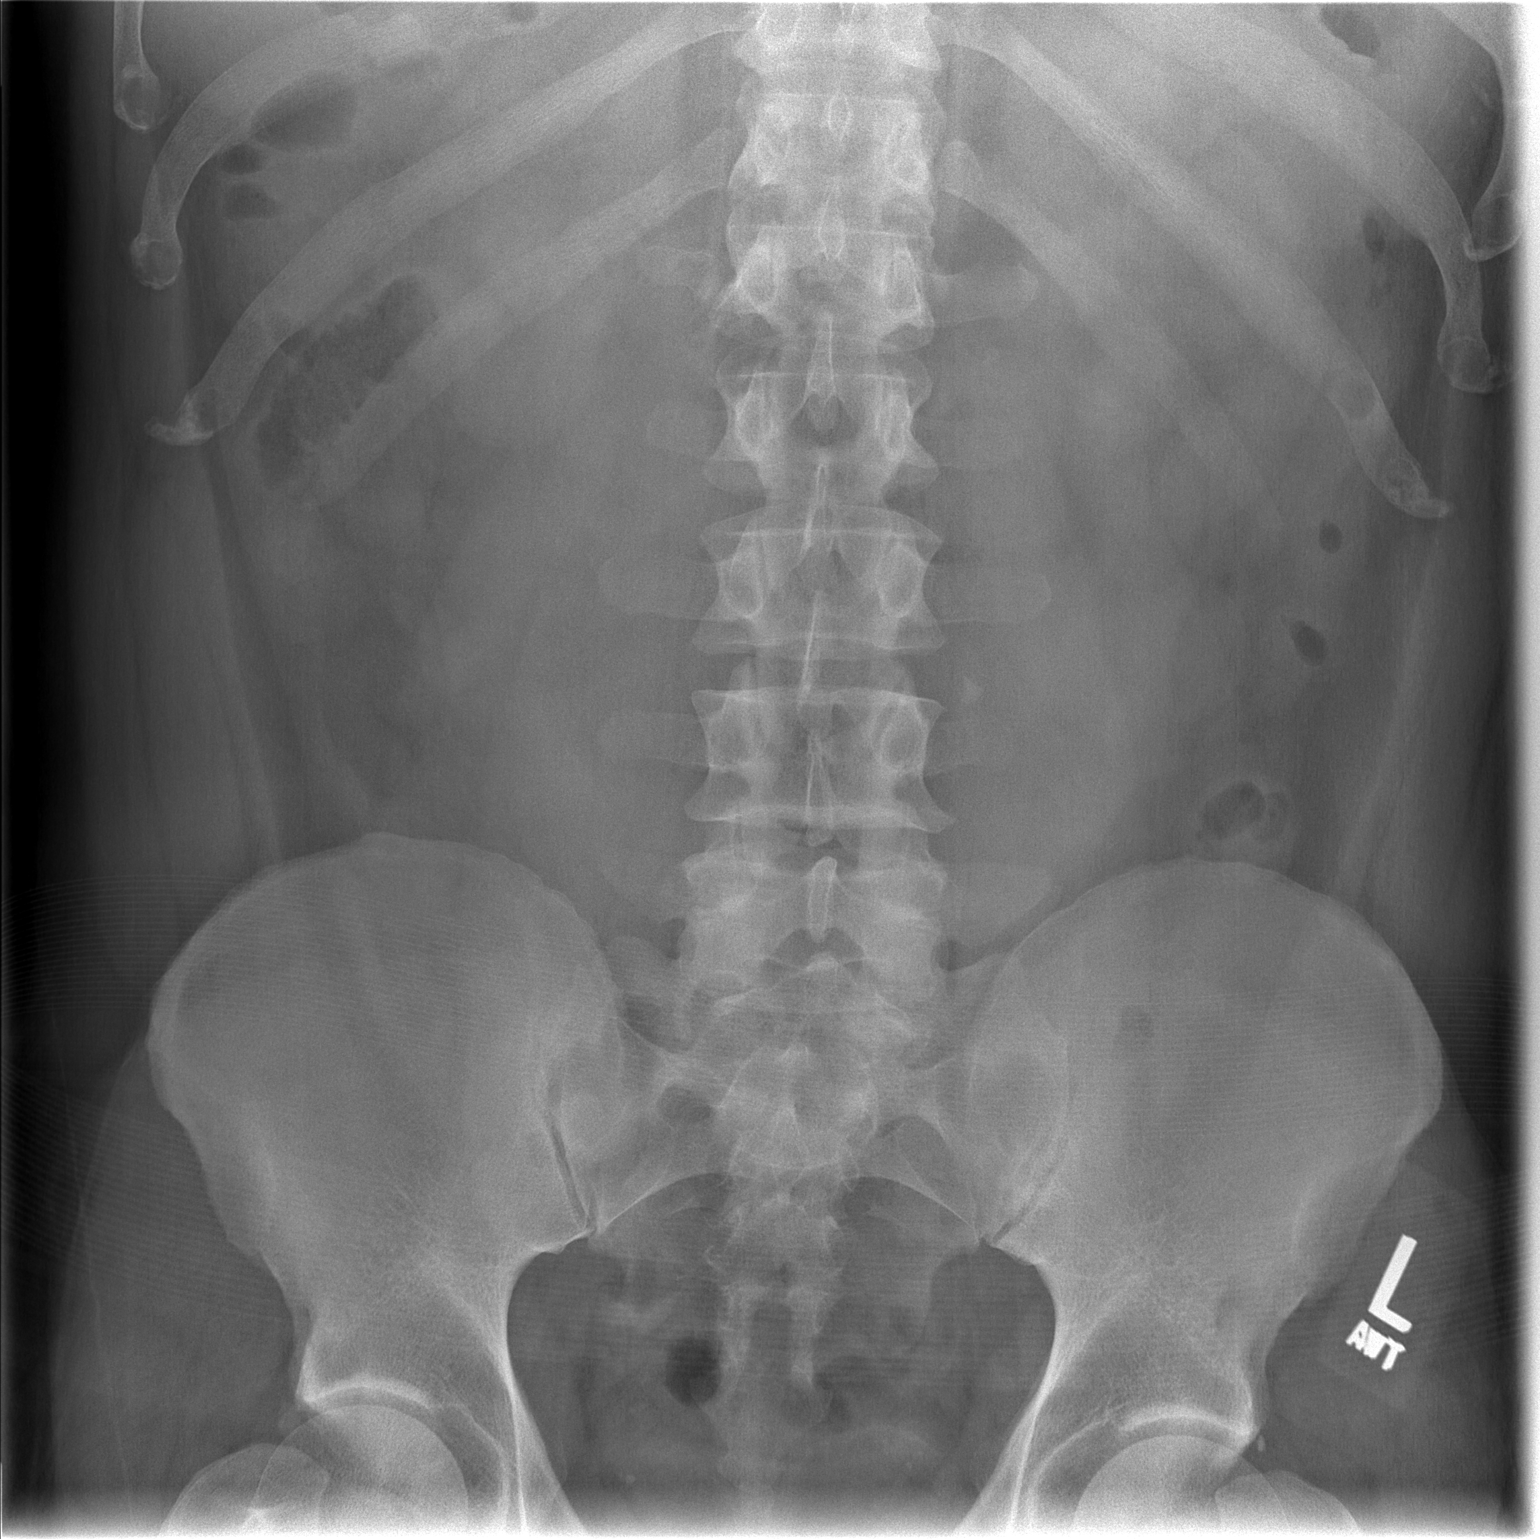

[t abdomen supine (2 of 2)]
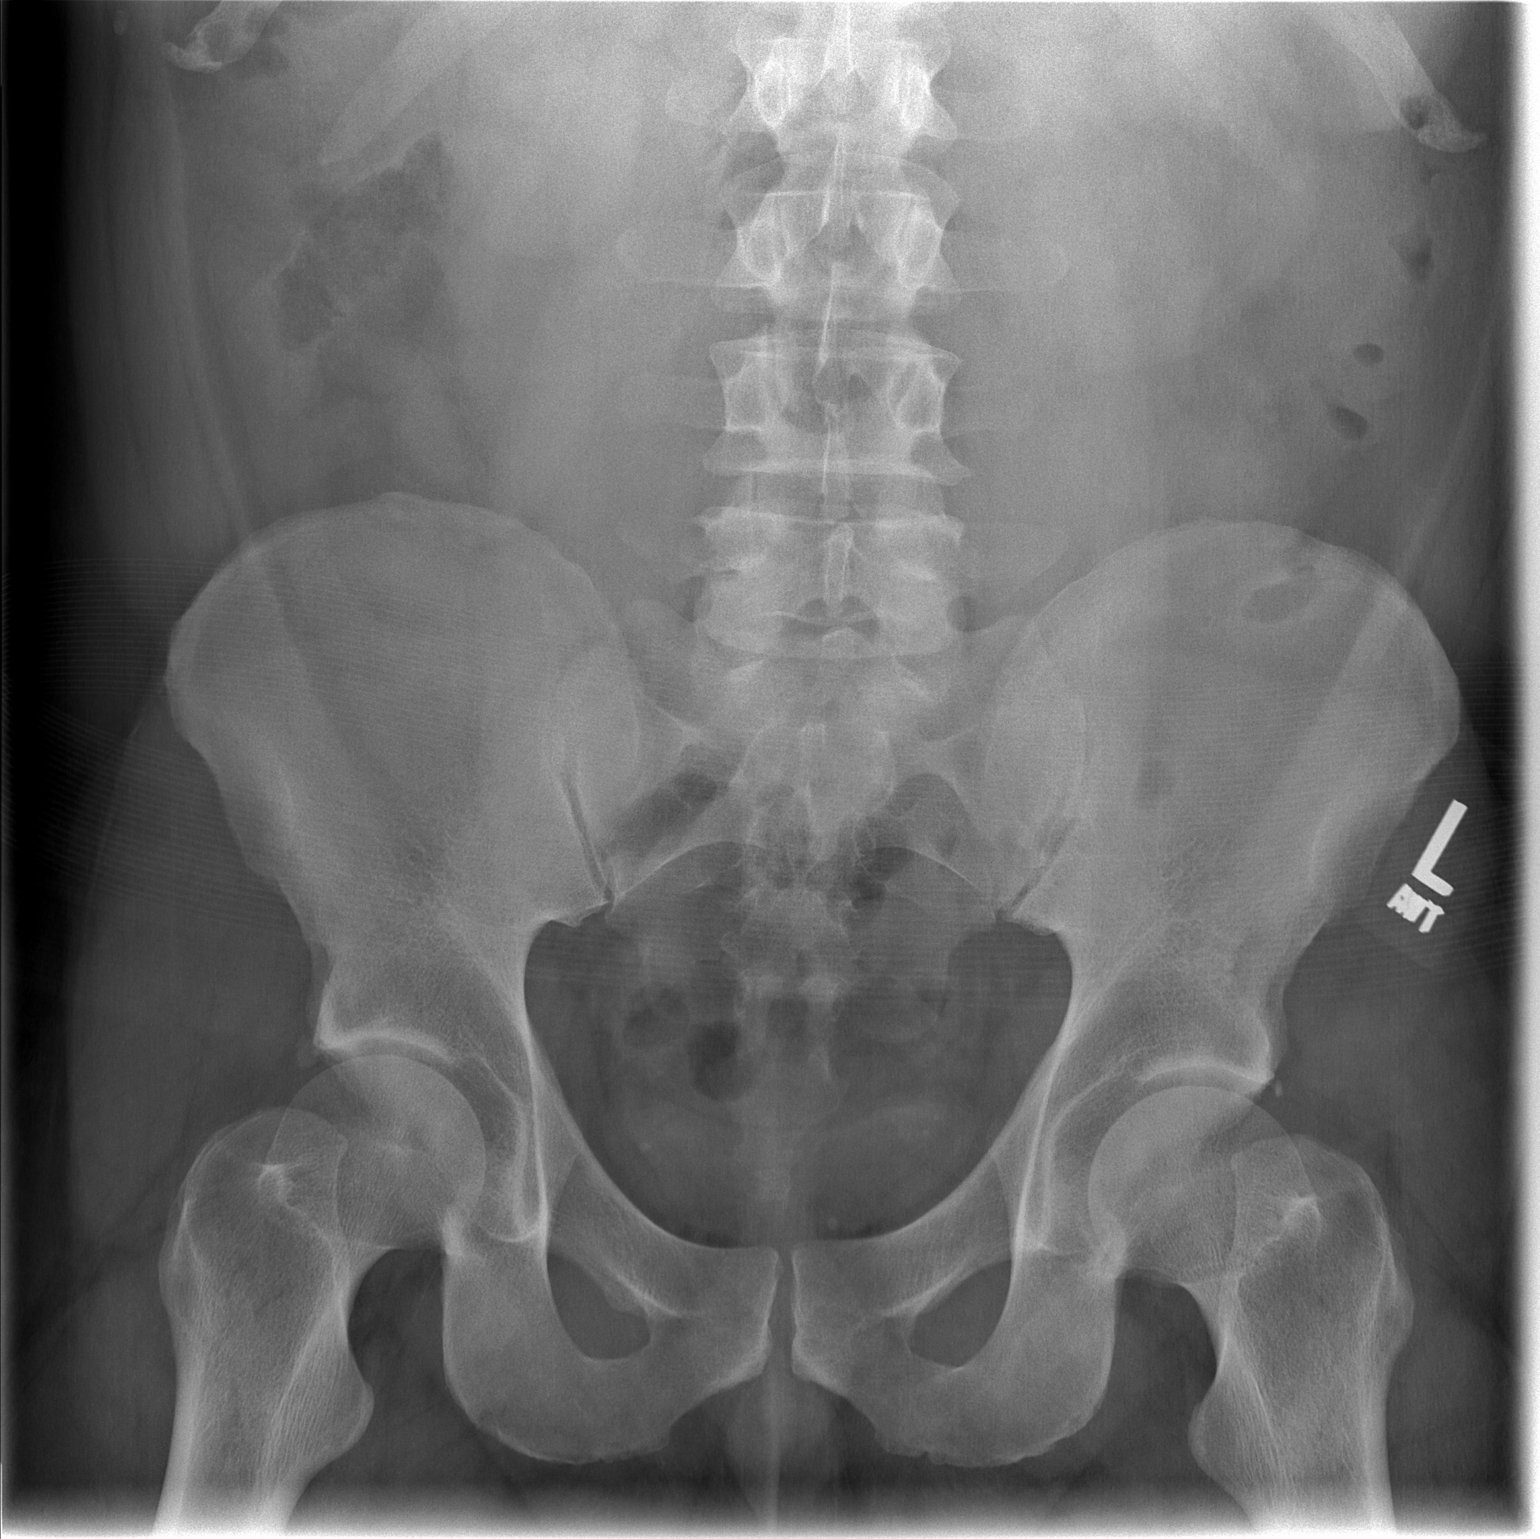

[2 of 2 positions shown; findings below may reference images not displayed]

FINDINGS: 7 mm calcification projects to the left of the L3-4 disc space
compatible with proximal to mid left ureteral stone. No additional
calcifications. Nonobstructive bowel gas pattern.
IMPRESSION: 7 mm proximal to mid left ureteral stone.

## 2018-05-21 DIAGNOSIS — R7989 Other specified abnormal findings of blood chemistry: Secondary | ICD-10-CM | POA: Diagnosis not present

## 2018-05-21 DIAGNOSIS — I10 Essential (primary) hypertension: Secondary | ICD-10-CM | POA: Diagnosis not present

## 2018-06-07 DIAGNOSIS — E782 Mixed hyperlipidemia: Secondary | ICD-10-CM | POA: Diagnosis not present

## 2018-06-07 DIAGNOSIS — Z23 Encounter for immunization: Secondary | ICD-10-CM | POA: Diagnosis not present

## 2018-06-07 DIAGNOSIS — G2581 Restless legs syndrome: Secondary | ICD-10-CM | POA: Diagnosis not present

## 2018-06-07 DIAGNOSIS — I10 Essential (primary) hypertension: Secondary | ICD-10-CM | POA: Diagnosis not present

## 2018-06-25 DIAGNOSIS — E782 Mixed hyperlipidemia: Secondary | ICD-10-CM | POA: Diagnosis not present

## 2018-06-25 DIAGNOSIS — R7989 Other specified abnormal findings of blood chemistry: Secondary | ICD-10-CM | POA: Diagnosis not present

## 2018-06-25 DIAGNOSIS — I10 Essential (primary) hypertension: Secondary | ICD-10-CM | POA: Diagnosis not present

## 2018-06-25 DIAGNOSIS — M109 Gout, unspecified: Secondary | ICD-10-CM | POA: Diagnosis not present

## 2018-07-02 DIAGNOSIS — M109 Gout, unspecified: Secondary | ICD-10-CM | POA: Diagnosis not present

## 2018-07-02 DIAGNOSIS — E782 Mixed hyperlipidemia: Secondary | ICD-10-CM | POA: Diagnosis not present

## 2018-07-02 DIAGNOSIS — I10 Essential (primary) hypertension: Secondary | ICD-10-CM | POA: Diagnosis not present

## 2018-08-16 DIAGNOSIS — N201 Calculus of ureter: Secondary | ICD-10-CM | POA: Diagnosis not present

## 2018-08-16 DIAGNOSIS — R1084 Generalized abdominal pain: Secondary | ICD-10-CM | POA: Diagnosis not present

## 2018-08-16 DIAGNOSIS — N132 Hydronephrosis with renal and ureteral calculous obstruction: Secondary | ICD-10-CM | POA: Diagnosis not present

## 2018-09-06 DIAGNOSIS — N201 Calculus of ureter: Secondary | ICD-10-CM | POA: Diagnosis not present

## 2018-09-13 DIAGNOSIS — R1084 Generalized abdominal pain: Secondary | ICD-10-CM | POA: Diagnosis not present

## 2018-09-15 DIAGNOSIS — I10 Essential (primary) hypertension: Secondary | ICD-10-CM | POA: Diagnosis not present

## 2018-09-15 DIAGNOSIS — R1084 Generalized abdominal pain: Secondary | ICD-10-CM | POA: Diagnosis not present

## 2018-09-15 DIAGNOSIS — Z6835 Body mass index (BMI) 35.0-35.9, adult: Secondary | ICD-10-CM | POA: Diagnosis not present

## 2019-01-13 DIAGNOSIS — Z719 Counseling, unspecified: Secondary | ICD-10-CM | POA: Diagnosis not present

## 2019-01-13 DIAGNOSIS — I10 Essential (primary) hypertension: Secondary | ICD-10-CM | POA: Diagnosis not present

## 2019-05-20 ENCOUNTER — Encounter (HOSPITAL_COMMUNITY): Payer: Self-pay | Admitting: Emergency Medicine

## 2019-05-20 ENCOUNTER — Emergency Department (HOSPITAL_COMMUNITY)
Admission: EM | Admit: 2019-05-20 | Discharge: 2019-05-20 | Disposition: A | Discharge status: Home / Self Care | Payer: 59 | Attending: Emergency Medicine | Admitting: Emergency Medicine

## 2019-05-20 ENCOUNTER — Other Ambulatory Visit: Payer: Self-pay

## 2019-05-20 ENCOUNTER — Emergency Department (HOSPITAL_COMMUNITY): Payer: 59

## 2019-05-20 DIAGNOSIS — Z79899 Other long term (current) drug therapy: Secondary | ICD-10-CM | POA: Diagnosis not present

## 2019-05-20 DIAGNOSIS — N201 Calculus of ureter: Secondary | ICD-10-CM | POA: Diagnosis not present

## 2019-05-20 DIAGNOSIS — R509 Fever, unspecified: Secondary | ICD-10-CM

## 2019-05-20 DIAGNOSIS — Z87891 Personal history of nicotine dependence: Secondary | ICD-10-CM | POA: Diagnosis not present

## 2019-05-20 DIAGNOSIS — J45909 Unspecified asthma, uncomplicated: Secondary | ICD-10-CM | POA: Insufficient documentation

## 2019-05-20 DIAGNOSIS — I10 Essential (primary) hypertension: Secondary | ICD-10-CM | POA: Insufficient documentation

## 2019-05-20 DIAGNOSIS — R339 Retention of urine, unspecified: Secondary | ICD-10-CM | POA: Diagnosis not present

## 2019-05-20 LAB — COMPREHENSIVE METABOLIC PANEL
ALT: 38 U/L (ref 0–44)
AST: 36 U/L (ref 15–41)
Albumin: 3.8 g/dL (ref 3.5–5.0)
Alkaline Phosphatase: 40 U/L (ref 38–126)
Anion gap: 9 (ref 5–15)
BUN: 19 mg/dL (ref 6–20)
CO2: 26 mmol/L (ref 22–32)
Calcium: 9.1 mg/dL (ref 8.9–10.3)
Chloride: 102 mmol/L (ref 98–111)
Creatinine, Ser: 1.38 mg/dL — ABNORMAL HIGH (ref 0.61–1.24)
GFR calc Af Amer: >60 mL/min (ref >60)
GFR calc non Af Amer: >60 mL/min (ref >60)
Glucose, Bld: 147 mg/dL — ABNORMAL HIGH (ref 70–99)
Potassium: 3.4 mmol/L — ABNORMAL LOW (ref 3.5–5.1)
Sodium: 137 mmol/L (ref 135–145)
Total Bilirubin: 0.4 mg/dL (ref 0.3–1.2)
Total Protein: 7 g/dL (ref 6.5–8.1)

## 2019-05-20 LAB — CBC WITH DIFFERENTIAL/PLATELET
Abs Immature Granulocytes: 0.05 10*3/uL (ref 0.00–0.07)
Basophils Absolute: 0 10*3/uL (ref 0.0–0.1)
Basophils Relative: 0 %
Eosinophils Absolute: 0 10*3/uL (ref 0.0–0.5)
Eosinophils Relative: 0 %
HCT: 44.4 % (ref 39.0–52.0)
Hemoglobin: 14.5 g/dL (ref 13.0–17.0)
Immature Granulocytes: 0 %
Lymphocytes Relative: 4 %
Lymphs Abs: 0.5 10*3/uL — ABNORMAL LOW (ref 0.7–4.0)
MCH: 29.1 pg (ref 26.0–34.0)
MCHC: 32.7 g/dL (ref 30.0–36.0)
MCV: 89 fL (ref 80.0–100.0)
Monocytes Absolute: 0.5 10*3/uL (ref 0.1–1.0)
Monocytes Relative: 4 %
Neutro Abs: 11.2 10*3/uL — ABNORMAL HIGH (ref 1.7–7.7)
Neutrophils Relative %: 92 %
Platelets: 173 10*3/uL (ref 150–400)
RBC: 4.99 MIL/uL (ref 4.22–5.81)
RDW: 13.7 % (ref 11.5–15.5)
WBC: 12.3 10*3/uL — ABNORMAL HIGH (ref 4.0–10.5)
nRBC: 0 % (ref 0.0–0.2)

## 2019-05-20 LAB — URINALYSIS, ROUTINE W REFLEX MICROSCOPIC
Bacteria, UA: NONE SEEN
Bilirubin Urine: NEGATIVE
Glucose, UA: NEGATIVE mg/dL
Ketones, ur: NEGATIVE mg/dL
Leukocytes,Ua: NEGATIVE
Nitrite: NEGATIVE
Protein, ur: NEGATIVE mg/dL
Specific Gravity, Urine: 1.016 (ref 1.005–1.030)
pH: 5 (ref 5.0–8.0)

## 2019-05-20 LAB — LACTIC ACID, PLASMA: Lactic Acid, Venous: 1.9 mmol/L (ref 0.5–1.9)

## 2019-05-20 MED ORDER — CEPHALEXIN 500 MG PO CAPS
500.0000 mg | ORAL_CAPSULE | Freq: Once | ORAL | Status: AC
  Administered 2019-05-20: 500 mg via ORAL
  Filled 2019-05-20: qty 1

## 2019-05-20 MED ORDER — SODIUM CHLORIDE 0.9 % IV SOLN: 1.0000 g | Freq: Once | INTRAVENOUS | Status: DC

## 2019-05-20 MED ORDER — SODIUM CHLORIDE 0.9 % IV BOLUS
1000.0000 mL | Freq: Once | INTRAVENOUS | Status: AC
  Administered 2019-05-20: 1000 mL via INTRAVENOUS

## 2019-05-20 MED ORDER — CEPHALEXIN 500 MG PO CAPS: 500.0000 mg | ORAL_CAPSULE | Freq: Three times a day (TID) | ORAL | 0 refills | Status: DC

## 2019-05-20 MED ORDER — ACETAMINOPHEN 325 MG PO TABS
650.0000 mg | ORAL_TABLET | Freq: Once | ORAL | Status: AC
  Administered 2019-05-20: 20:00:00 650 mg via ORAL
  Filled 2019-05-20: qty 2

## 2019-05-20 MED ORDER — ONDANSETRON 4 MG PO TBDP: 4.0000 mg | ORAL_TABLET | Freq: Three times a day (TID) | ORAL | 0 refills | Status: DC | PRN

## 2019-05-20 NOTE — ED Triage Notes (Signed)
Took tylenol for fever around 2pm. Pt c/o sudden fever and chills that came on suddenly today. Reports had urinary retention for past couple weeks and thought was related to kidney stone as has hx of same. Marland Kitchen

## 2019-05-20 NOTE — ED Notes (Signed)
Pt was verbalized discharge instructions. Pt had no further questions at this time. NAD. 

## 2019-05-20 NOTE — ED Provider Notes (Signed)
[facility] DEPT Provider Note   CSN: [id number] Arrival date & time: 05/20/19  1742     History   Chief Complaint Chief Complaint  Patient presents with   Fever   Chills   Urinary Retention    HPI Jason Benitez is a 48 y.o. male who presents with fever, chills, difficulty urinating.  Past medical history significant for kidney stones.  He states that over the past couple of weeks he is felt like he has had kidney stone pain on the right side.  The pain comes and goes.  It has not been severe constant so he did not come to the emergency department for it.  He states he is passed multiple stones in the past at home is also had lithotripsy before.  Since yesterday he has developed a fever as high as 102 with associated chills and a headache.  He continues to have difficulty urinating but is able to urinate.  Denies history of prostate issues.  [facility].  No chest pain, shortness of breath, cough, vomiting, diarrhea.  No blood in the urine but the urine appears dark.     HPI  Past Medical History:  Diagnosis Date   ALLERGIC RHINITIS 05/03/2007   Anxiety    ASTHMA 05/03/2007   Asthma    allergy related   DYSLIPIDEMIA 05/03/2007   EXOGENOUS OBESITY 05/03/2007   History of kidney stones    Hypertension    LOW BACK PAIN 03/30/2008   NEPHROLITHIASIS, HX OF 03/30/2008    Patient Active Problem List   Diagnosis Date Noted   LOW BACK PAIN 03/30/2008   NEPHROLITHIASIS, HX OF 03/30/2008   DYSLIPIDEMIA 05/03/2007   EXOGENOUS OBESITY 05/03/2007   ALLERGIC RHINITIS 05/03/2007   ASTHMA 05/03/2007    Past Surgical History:  Procedure Laterality Date   EXTRACORPOREAL SHOCK WAVE LITHOTRIPSY Left 05/14/2017   Procedure: EXTRACORPOREAL SHOCK WAVE LITHOTRIPSY (ESWL);  Surgeon: Irine Seal, MD;  Location: [facility];  Service: Urology;  Laterality: Left;   NO PAST SURGERIES          Home Medications     Prior to Admission medications   Medication Sig Start Date End Date Taking? Authorizing Provider  ALLOPURINOL PO Take 100 mg by mouth.    [provider]  escitalopram (LEXAPRO) 10 MG tablet Take 10 mg by mouth daily.    [provider]  fenofibrate (TRICOR) 48 MG tablet Take 48 mg by mouth daily.    [provider]  fluticasone (FLONASE) 50 MCG/ACT nasal spray Place 2 sprays into the nose daily. 03/15/13   Marletta Lor, MD  HYDROcodone-acetaminophen (NORCO/VICODIN) 5-325 MG tablet Take 1 tablet by mouth every 6 (six) hours as needed for moderate pain.    [provider]  ketorolac (TORADOL) 10 MG tablet Take 10 mg by mouth every 6 (six) hours as needed.    [provider]  loratadine (CLARITIN) 10 MG tablet Take 10 mg by mouth daily.    [provider]  losartan-hydrochlorothiazide (HYZAAR) 50-12.5 MG tablet Take 1 tablet by mouth daily.    [provider]  omeprazole (PRILOSEC) 20 MG capsule Take 1 capsule (20 mg total) by mouth daily. 07/19/14   Marletta Lor, MD  rosuvastatin (CRESTOR) 5 MG tablet Take 5 mg by mouth daily.    [provider]  tamsulosin (FLOMAX) 0.4 MG CAPS capsule Take 0.4 mg by mouth.    [provider]  Family History Family History  Problem Relation Age of Onset   Diabetes Mother    Hypertension Mother    Diabetes Father    Cancer Father        pancreatic   Cancer Maternal Grandfather        lung ca    Social History Social History   Tobacco Use   Smoking status: Former Smoker    Quit date: 10/20/1990    Years since quitting: 28.6   Smokeless tobacco: Never Used  Substance Use Topics   Alcohol use: Yes   Drug use: No     Allergies   Patient has no known allergies.   Review of Systems Review of Systems  Constitutional: Positive for chills and fever.  Gastrointestinal: Positive for nausea. Negative for abdominal pain, diarrhea and vomiting.   Genitourinary: Positive for difficulty urinating and flank pain (resolved). Negative for dysuria, hematuria and testicular pain.  Neurological: Positive for headaches.  All other systems reviewed and are negative.    Physical Exam Updated Vital Signs BP 125/85 (BP Location: Left Arm)    Pulse (!) 130    Temp 100.1 F (37.8 C) (Oral)    Resp 18    SpO2 97%   Physical Exam Vitals signs and nursing note reviewed.  Constitutional:      General: He is not in acute distress.    Appearance: Normal appearance. He is well-developed. He is not ill-appearing.     Comments: Calm. Uncomfortable appearing  HENT:     Head: Normocephalic and atraumatic.  Eyes:     General: No scleral icterus.       Right eye: No discharge.        Left eye: No discharge.     Conjunctiva/sclera: Conjunctivae normal.     Pupils: Pupils are equal, round, and reactive to light.  Neck:     Musculoskeletal: Normal range of motion.  Cardiovascular:     Rate and Rhythm: Tachycardia present.  Pulmonary:     Effort: Pulmonary effort is normal. No respiratory distress.     Breath sounds: Normal breath sounds.  Abdominal:     General: There is no distension.     Palpations: Abdomen is soft.     Tenderness: There is no abdominal tenderness.  Skin:    General: Skin is warm and dry.  Neurological:     Mental Status: He is alert and oriented to person, place, and time.  Psychiatric:        Behavior: Behavior normal.      ED Treatments / Results  Labs (all labs ordered are listed, but only abnormal results are displayed) Labs Reviewed  COMPREHENSIVE METABOLIC PANEL - Abnormal; Notable for the following components:      Result Value   Potassium 3.4 (*)    Glucose, Bld 147 (*)    Creatinine, Ser 1.38 (*)    All other components within normal limits  CBC WITH DIFFERENTIAL/PLATELET - Abnormal; Notable for the following components:   WBC 12.3 (*)    Neutro Abs 11.2 (*)    Lymphs Abs 0.5 (*)    All other  components within normal limits  URINALYSIS, ROUTINE W REFLEX MICROSCOPIC - Abnormal; Notable for the following components:   APPearance HAZY (*)    Hgb urine dipstick SMALL (*)    All other components within normal limits  URINE CULTURE  LACTIC ACID, PLASMA    EKG None  Radiology Ct Renal Stone Study  Result Date: 05/20/2019 CLINICAL DATA:  Flank pain EXAM: CT ABDOMEN AND PELVIS WITHOUT CONTRAST TECHNIQUE: Multidetector CT imaging of the abdomen and pelvis was performed following the standard protocol without IV contrast. COMPARISON:  CT dated May 07, 2017. FINDINGS: Lower chest: The lung bases are clear. The heart size is normal. Hepatobiliary: There is decreased hepatic attenuation suggestive of hepatic steatosis. Normal gallbladder.There is no biliary ductal dilation. Pancreas: Normal contours without ductal dilatation. No peripancreatic fluid collection. Spleen: Again noted is borderline splenomegaly. Adrenals/Urinary Tract: --Adrenal glands: No adrenal hemorrhage. --Right kidney/ureter: There is a nonobstructing 2 mm stone in the distal right ureter (axial series 2, image 88). --Left kidney/ureter: No hydronephrosis or perinephric hematoma. --Urinary bladder: Unremarkable. Stomach/Bowel: --Stomach/Duodenum: No hiatal hernia or other gastric abnormality. Normal duodenal course and caliber. --Small bowel: No dilatation or inflammation. --Colon: No focal abnormality. --Appendix: Normal. Vascular/Lymphatic: Atherosclerotic calcification is present within the non-aneurysmal abdominal aorta, without hemodynamically significant stenosis. --No retroperitoneal lymphadenopathy. --No mesenteric lymphadenopathy. --No pelvic or inguinal lymphadenopathy. Reproductive: Unremarkable Other: No ascites or free air. The abdominal wall is normal. Musculoskeletal. No acute displaced fractures. IMPRESSION: 1. There is a 2 mm stone in the distal right ureter without significant right-sided hydronephrosis. 2. Hepatic  steatosis. 3. Borderline splenomegaly. Electronically Signed   By: Constance Holster M.D.   On: 05/20/2019 19:40    Procedures Procedures (including critical care time)  Medications Ordered in ED Medications  acetaminophen (TYLENOL) tablet 650 mg (650 mg Oral Given 05/20/19 1940)  sodium chloride 0.9 % bolus 1,000 mL (0 mLs Intravenous Stopped 05/20/19 2210)  cephALEXin (KEFLEX) capsule 500 mg (500 mg Oral Given 05/20/19 2234)     Initial Impression / Assessment and Plan / ED Course  I have reviewed the triage vital signs and the nursing notes.  Pertinent labs & imaging results that were available during my care of the patient were reviewed by me and considered in my medical decision making (see chart for details).  48 year old male presents with fever, chills, difficulty urinating.  He has had some right-sided flank pain over the past couple weeks and reports history of frequent kidney stones.  On exam he is tachycardic.  Lungs are clear to auscultation.  Abdomen is soft and nontender.  Will order labs, UA, CT renal.  Will give fluids and Tylenol.  UA does not show any evidence of infection.  We will send off a urine culture.  CBC is remarkable for mild leukocytosis of 12.3.  Lactate is normal.  CMP is remarkable for mild hypokalemia 3.4 and slightly elevated creatinine.  CT renal is consistent with a 2 mm distal ureteral stone without hydronephrosis.  Discussed with Dr. Kathrynn Humble.  Do not feel like patient needs admission at this time.  He is given Keflex to cover for possible kidney infection as well as Zofran.  He is encouraged to take Tylenol or ibuprofen as needed for fever.  He was given strict return precautions for worsening symptoms.  Final Clinical Impressions(s) / ED Diagnoses   Final diagnoses:  Calculus of ureter  Fever and chills    ED Discharge Orders    None       Recardo Evangelist, PA-C 05/21/19 Billings, Ankit, MD 05/21/19 [phone]

## 2019-05-20 NOTE — ED Notes (Addendum)
Bladder scanner completed, volume 0 mL.

## 2019-05-20 NOTE — ED Notes (Addendum)
Pt ambulated to restroom with no assistance. ?

## 2019-05-20 NOTE — Discharge Instructions (Signed)
Start Keflex and take three times daily for 10 days Take Zofran as needed for nausea Take Tylenol as needed for fever and chills Please return if worsening

## 2019-05-22 LAB — URINE CULTURE: Culture: NO GROWTH

## 2019-05-29 ENCOUNTER — Other Ambulatory Visit: Payer: Self-pay | Admitting: Urology

## 2019-05-29 MED ORDER — TAMSULOSIN HCL 0.4 MG PO CAPS: 0.4000 mg | ORAL_CAPSULE | Freq: Every day | ORAL | 0 refills | Status: DC

## 2019-05-29 MED ORDER — KETOROLAC TROMETHAMINE 10 MG PO TABS: 10.0000 mg | ORAL_TABLET | Freq: Four times a day (QID) | ORAL | 0 refills | Status: DC | PRN

## 2019-05-29 NOTE — Progress Notes (Signed)
Returned patient call. Diagnosed with stone on 05/20/19 and still having stone pain. No fevers/chills. Urine culture from 05/20/19 is no growth. 62mm stone.  Sent Toradol and Flomax to pharmacy. He will call in AM for an appointment if symptoms do not resolve.

## 2019-10-26 ENCOUNTER — Ambulatory Visit: Payer: 59 | Attending: Internal Medicine

## 2019-10-26 DIAGNOSIS — Z20822 Contact with and (suspected) exposure to covid-19: Secondary | ICD-10-CM

## 2019-10-27 LAB — NOVEL CORONAVIRUS, NAA: SARS-CoV-2, NAA: NOT DETECTED

## 2019-12-16 ENCOUNTER — Ambulatory Visit (AMBULATORY_SURGERY_CENTER): Payer: Self-pay | Admitting: *Deleted

## 2019-12-16 ENCOUNTER — Other Ambulatory Visit: Payer: Self-pay

## 2019-12-16 VITALS — Temp 97.5°F | Ht 70.0 in | Wt 272.0 lb

## 2019-12-16 DIAGNOSIS — Z01818 Encounter for other preprocedural examination: Secondary | ICD-10-CM

## 2019-12-16 DIAGNOSIS — Z1211 Encounter for screening for malignant neoplasm of colon: Secondary | ICD-10-CM

## 2019-12-16 MED ORDER — NA SULFATE-K SULFATE-MG SULF 17.5-3.13-1.6 GM/177ML PO SOLN: 1.0000 | Freq: Once | ORAL | 0 refills | Status: AC

## 2019-12-16 NOTE — Progress Notes (Signed)

## 2019-12-27 ENCOUNTER — Ambulatory Visit (INDEPENDENT_AMBULATORY_CARE_PROVIDER_SITE_OTHER): Payer: Commercial Managed Care - PPO

## 2019-12-27 ENCOUNTER — Other Ambulatory Visit: Payer: Self-pay

## 2019-12-27 ENCOUNTER — Other Ambulatory Visit: Payer: Self-pay | Admitting: Gastroenterology

## 2019-12-27 DIAGNOSIS — Z1159 Encounter for screening for other viral diseases: Secondary | ICD-10-CM

## 2019-12-28 ENCOUNTER — Encounter: Payer: Self-pay | Admitting: Gastroenterology

## 2019-12-28 LAB — SARS CORONAVIRUS 2 (TAT 6-24 HRS): SARS Coronavirus 2: NEGATIVE

## 2019-12-30 ENCOUNTER — Other Ambulatory Visit: Payer: Self-pay

## 2019-12-30 ENCOUNTER — Ambulatory Visit (AMBULATORY_SURGERY_CENTER): Payer: Commercial Managed Care - PPO | Admitting: Gastroenterology

## 2019-12-30 ENCOUNTER — Encounter: Payer: Self-pay | Admitting: Gastroenterology

## 2019-12-30 VITALS — BP 110/70 | HR 77 | Temp 97.3°F | Resp 9 | Ht 70.0 in | Wt 272.0 lb

## 2019-12-30 DIAGNOSIS — Z1211 Encounter for screening for malignant neoplasm of colon: Secondary | ICD-10-CM

## 2019-12-30 MED ORDER — SODIUM CHLORIDE 0.9 % IV SOLN: 500.0000 mL | Freq: Once | INTRAVENOUS | Status: DC

## 2019-12-30 NOTE — Patient Instructions (Signed)
YOU HAD AN ENDOSCOPIC PROCEDURE TODAY AT THE [facility]:   Refer to the procedure report that was given to you for any specific questions about what was found during the examination.  If the procedure report does not answer your questions, please call your gastroenterologist to clarify.  If you requested that your care partner not be given the details of your procedure findings, then the procedure report has been included in a sealed envelope for you to review at your convenience later.  YOU SHOULD EXPECT: Some feelings of bloating in the abdomen. Passage of more gas than usual.  Walking can help get rid of the air that was put into your GI tract during the procedure and reduce the bloating. If you had a lower endoscopy (such as a colonoscopy or flexible sigmoidoscopy) you may notice spotting of blood in your stool or on the toilet paper. If you underwent a bowel prep for your procedure, you may not have a normal bowel movement for a few days.  Please Note:  You might notice some irritation and congestion in your nose or some drainage.  This is from the oxygen used during your procedure.  There is no need for concern and it should clear up in a day or so.  SYMPTOMS TO REPORT IMMEDIATELY:   Following lower endoscopy (colonoscopy or flexible sigmoidoscopy):  Excessive amounts of blood in the stool  Significant tenderness or worsening of abdominal pains  Swelling of the abdomen that is new, acute  Fever of 100F or higher  For urgent or emergent issues, a gastroenterologist can be reached at any hour by calling [phone]. Do not use MyChart messaging for urgent concerns.    DIET:  We do recommend a small meal at first, but then you may proceed to your regular diet.  Drink plenty of fluids but you should avoid alcoholic beverages for 24 hours.  ACTIVITY:  You should plan to take it easy for the rest of today and you should NOT DRIVE or use heavy machinery until tomorrow (because  of the sedation medicines used during the test).    FOLLOW UP: Our staff will call the number listed on your records 48-72 hours following your procedure to check on you and address any questions or concerns that you may have regarding the information given to you following your procedure. If we do not reach you, we will leave a message.  We will attempt to reach you two times.  During this call, we will ask if you have developed any symptoms of COVID 19. If you develop any symptoms (ie: fever, flu-like symptoms, shortness of breath, cough etc.) before then, please call [phone].  If you test positive for Covid 19 in the 2 weeks post procedure, please call and report this information to us.    If any biopsies were taken you will be contacted by phone or by letter within the next 1-3 weeks.  Please call us at [phone] if you have not heard about the biopsies in 3 weeks.    SIGNATURES/CONFIDENTIALITY: You and/or your care partner have signed paperwork which will be entered into your electronic medical record.  These signatures attest to the fact that that the information above on your After Visit Summary has been reviewed and is understood.  Full responsibility of the confidentiality of this discharge information lies with you and/or your care-partner. 

## 2019-12-30 NOTE — Progress Notes (Signed)
Pt was asleep for first 20 minutes in RR, unable to arouse, pupils were pin-point in size, breathing easy and unlabored, snoring at times, O2 sats at 88-90, O2 applied at 4 L then 2 L,  first 10 minutes. then saturation above 92 after that on room air. Pt wakes slowly, see flowsheet for VS, reinforced with pt to take slow movements, deliberate movement today, no alcohol, no driving, no yardwork or any work today.

## 2019-12-30 NOTE — Progress Notes (Signed)
Pt's states no medical or surgical changes since previsit or office visit.  Temp taken by LC VS taken by CW  

## 2019-12-30 NOTE — Progress Notes (Signed)
Report to PACU, RN, vss, BBS= Clear.  

## 2019-12-30 NOTE — Op Note (Signed)
[facility] Patient Name: Jason Benitez Procedure Date: 12/30/2019 2:04 PM MRN: [id number] Endoscopist: Thornton Park MD, MD Age: 49 Referring MD:  Date of Birth: 1971/08/30 Gender: Male Account #: [id number] Procedure:                Colonoscopy Indications:              Screening for colorectal malignant neoplasm, This                            is the patient's first colonoscopy Medicines:                Monitored Anesthesia Care Procedure:                Pre-Anesthesia Assessment:                           - Prior to the procedure, a History and Physical                            was performed, and patient medications and                            allergies were reviewed. The patient's tolerance of                            previous anesthesia was also reviewed. The risks                            and benefits of the procedure and the sedation                            options and risks were discussed with the patient.                            All questions were answered, and informed consent                            was obtained. Prior Anticoagulants: The patient has                            taken no previous anticoagulant or antiplatelet                            agents. ASA Grade Assessment: II - A patient with                            mild systemic disease. After reviewing the risks                            and benefits, the patient was deemed in                            satisfactory condition to undergo the procedure.  After obtaining informed consent, the colonoscope                            was passed under direct vision. Throughout the                            procedure, the patient's blood pressure, pulse, and                            oxygen saturations were monitored continuously. The                            Colonoscope was introduced through the anus and                            advanced to the the  cecum, identified by                            appendiceal orifice and ileocecal valve. The                            colonoscopy was performed without difficulty. The                            patient tolerated the procedure well. The quality                            of the bowel preparation was good. The ileocecal                            valve, appendiceal orifice, and rectum were                            photographed. Scope In: 2:20:47 PM Scope Out: 2:31:58 PM Scope Withdrawal Time: 0 hours 9 minutes 7 seconds  Total Procedure Duration: 0 hours 11 minutes 11 seconds  Findings:                 The perianal and digital rectal examinations were                            normal.                           The colon (entire examined portion) appeared normal.                           The exam was otherwise without abnormality on                            direct and retroflexion views. Complications:            No immediate complications. Estimated Blood Loss:     Estimated blood loss: none. Impression:               - The entire examined colon is normal.                           -  The examined portion of the ileum was normal.                           - The examination was otherwise normal on direct                            and retroflexion views.                           - No specimens collected. Recommendation:           - Patient has a contact number available for                            emergencies. The signs and symptoms of potential                            delayed complications were discussed with the                            patient. Return to normal activities tomorrow.                            Written discharge instructions were provided to the                            patient.                           - Resume previous diet.                           - Continue present medications.                           - Repeat colonoscopy in 10 years for  screening                            purposes.                           - Emerging evidence supports eating a diet of                            fruits, vegetables, grains, calcium, and yogurt                            while reducing red meat and alcohol may reduce the                            risk of colon cancer.                           - Thank you for allowing me to be involved in your  colon cancer prevention. Thornton Park MD, MD 12/30/2019 2:36:44 PM This report has been signed electronically.

## 2020-01-03 ENCOUNTER — Telehealth: Payer: Self-pay

## 2020-01-03 NOTE — Telephone Encounter (Signed)
  Follow up Call-  Call back number 12/30/2019  Post procedure Call Back phone  # [phone]  Permission to leave phone message Yes  Some recent data might be hidden     Patient questions:  Do you have a fever, pain , or abdominal swelling? No. Pain Score  0 *  Have you tolerated food without any problems? Yes.    Have you been able to return to your normal activities? Yes.    Do you have any questions about your discharge instructions: Diet   No. Medications  No. Follow up visit  No.  Do you have questions or concerns about your Care? No.  Actions: * If pain score is 4 or above: No action needed, pain <4.  1. Have you developed a fever since your procedure? no  2.   Have you had an respiratory symptoms (SOB or cough) since your procedure? no  3.   Have you tested positive for COVID 19 since your procedure no  4.   Have you had any family members/close contacts diagnosed with the COVID 19 since your procedure?  no   If yes to any of these questions please route to Joylene John, RN and Alphonsa Gin, Therapist, sports.

## 2021-06-27 ENCOUNTER — Ambulatory Visit: Payer: Commercial Managed Care - PPO | Admitting: Neurology

## 2021-06-27 ENCOUNTER — Encounter: Payer: Self-pay | Admitting: Neurology

## 2021-06-27 VITALS — BP 124/86 | HR 92 | Ht 71.0 in | Wt 277.0 lb

## 2021-06-27 DIAGNOSIS — R0683 Snoring: Secondary | ICD-10-CM | POA: Diagnosis not present

## 2021-06-27 DIAGNOSIS — G4711 Idiopathic hypersomnia with long sleep time: Secondary | ICD-10-CM

## 2021-06-27 DIAGNOSIS — G4761 Periodic limb movement disorder: Secondary | ICD-10-CM

## 2021-06-27 DIAGNOSIS — Z6838 Body mass index (BMI) 38.0-38.9, adult: Secondary | ICD-10-CM

## 2021-06-27 DIAGNOSIS — E6609 Other obesity due to excess calories: Secondary | ICD-10-CM | POA: Diagnosis not present

## 2021-06-27 DIAGNOSIS — G478 Other sleep disorders: Secondary | ICD-10-CM | POA: Diagnosis not present

## 2021-06-27 NOTE — Progress Notes (Signed)
SLEEP MEDICINE CLINIC    Provider:  Larey Seat, MD  Primary Care Physician:  Fanny Bien, [address] [place] [address]     Referring Provider: Fanny Bien, [address] [place],  [place] [address]          Chief Complaint according to patient   Patient presents with:     New Patient (Initial Visit)           HISTORY OF PRESENT ILLNESS:  Jason Benitez is a 50 y.o. year old White or Caucasian male patient seen here as a referral on 06/27/2021 from PCP for a sleep consultation.   Chief concern according to patient :  Rm 10, alone. Paper referral for snoring, RLS, obesity and HTN. No prior SS or CPAP.   " If I have an opportunity to sleep, I will. All my life felt I slept well. My wife feels bothered by my snoring and my RLS "   I have the pleasure of seeing Jason Benitez today, a right -handed White or Caucasian male with a possible sleep disorder.  He   has a past medical history of ALLERGIC RHINITIS (05/03/2007), Allergy, Anemia, Anxiety, ASTHMA (05/03/2007), Asthma, Chronic kidney disease, Depression, DYSLIPIDEMIA (05/03/2007), EXOGENOUS OBESITY (05/03/2007), History of kidney stones, Hypertension, LOW BACK PAIN (03/30/2008), NEPHROLITHIASIS, HX OF (03/30/2008), and Neuromuscular disorder ([place]).      Sleep relevant medical history: leg feels like an electrical current, arising from the groin, walking helps temporarily. Apparently I do this during the night.      Family medical /sleep history: parents  on CPAP with OSA, father has passed, had RLS too.    Social history:  Patient is working as Freight forwarder , office - desk job- with travel and lives in a household with spouse,  the youngest son, last year of HS- 4 dogs. [place] pig and a lizard.   Tobacco use quit 1992, dipped tobacco .   ETOH use - drinks 3-4 times a week , chewing gummies with hemp,  Caffeine intake in form of Coffee( 2-3 cups in AM ) Soda( /) Tea ( /) or energy  drinks. Regular exercise : none .  Used to weight lift.  Hobbies : family time       Sleep habits are as follows: The patient's dinner time is between 6 PM. The patient goes to bed at 9 PM and continues to sleep for 9-6.30  hours, wakes for 1 bathroom break.  Loud snoring when sleeping supine or sideways, even prone  The preferred sleep position is side ways- both shoulders are painful- Rotator cuff - , with the support of 1-2 pillows. Dreams are reportedly frequent. His dogs are in the bedroom.    6.30 AM is the usual rise time. The patient wakes up with an alarm.  He reports not feeling refreshed or restored in AM, he was always a non -morning person.   Naps are taken infrequently, only on WE- lasting from 15 minutes to -3 hours and not affecting night time sleep.      Review of Systems: Out of a complete 14 system review, the patient complains of only the following symptoms, and all other reviewed systems are negative.:  Snoring, non restorative sleep- nor excessively sleepy- shoulder pain. Sleeping 9 hours on average, kicking in sleep.    How likely are you to doze in the following situations: 0 = not likely, 1 =  slight chance, 2 = moderate chance, 3 = high chance   Sitting and Reading? Watching Television? Sitting inactive in a public place (theater or meeting)? As a passenger in a car for an hour without a break? Lying down in the afternoon when circumstances permit? Sitting and talking to someone? Sitting quietly after lunch without alcohol? In a car, while stopped for a few minutes in traffic?   Total = 2/ 24 points   FSS endorsed at 9/ 63 points.   Social History   Socioeconomic History   Marital status: Married    Spouse name: Not on file   Number of children: Not on file   Years of education: Not on file   Highest education level: Not on file  Occupational History   Not on file  Tobacco Use   Smoking status: Former    Types: Cigarettes    Quit date: 10/20/1990     Years since quitting: 30.7   Smokeless tobacco: Never  Vaping Use   Vaping Use: Never used  Substance and Sexual Activity   Alcohol use: Yes   Drug use: No   Sexual activity: Not on file  Other Topics Concern   Not on file  Social History Narrative   Not on file   Social Determinants of Health   Financial Resource Strain: Not on file  Food Insecurity: Not on file  Transportation Needs: Not on file  Physical Activity: Not on file  Stress: Not on file  Social Connections: Not on file    Family History  Problem Relation Age of Onset   Diabetes Mother    Hypertension Mother    Diabetes Father    Cancer Father        pancreatic   Cancer Maternal Grandfather        lung ca   Colon cancer Neg Hx    Esophageal cancer Neg Hx    Stomach cancer Neg Hx    Rectal cancer Neg Hx     Past Medical History:  Diagnosis Date   ALLERGIC RHINITIS 05/03/2007   Allergy    Anemia    Anxiety    ASTHMA 05/03/2007   Asthma    allergy related   Chronic kidney disease    multiple kidney stones   Depression    DYSLIPIDEMIA 05/03/2007   EXOGENOUS OBESITY 05/03/2007   History of kidney stones    Hypertension    LOW BACK PAIN 03/30/2008   NEPHROLITHIASIS, HX OF 03/30/2008   Neuromuscular disorder ([place])    restless legs    Past Surgical History:  Procedure Laterality Date   EXTRACORPOREAL SHOCK WAVE LITHOTRIPSY Left 05/14/2017   Procedure: EXTRACORPOREAL SHOCK WAVE LITHOTRIPSY (ESWL);  Surgeon: Irine Seal, MD;  Location: [facility];  Service: Urology;  Laterality: Left;   NO PAST SURGERIES       Current Outpatient Medications on File Prior to Visit  Medication Sig Dispense Refill   albuterol (VENTOLIN HFA) 108 (90 Base) MCG/ACT inhaler Inhale 2 puffs into the lungs every 4 (four) hours as needed.     amLODipine (NORVASC) 2.5 MG tablet Take 2.5 mg by mouth daily.     buPROPion (WELLBUTRIN XL) 150 MG 24 hr tablet Take 150 mg by mouth every morning.     escitalopram (LEXAPRO) 20 MG tablet  Take 20 mg by mouth daily.     fenofibrate 160 MG tablet Take 160 mg by mouth daily.     fluticasone (FLONASE) 50 MCG/ACT nasal spray Place 2 sprays into  the nose daily. 16 g 5   loratadine (CLARITIN) 10 MG tablet Take 10 mg by mouth daily.     montelukast (SINGULAIR) 10 MG tablet Take 10 mg by mouth daily.     rosuvastatin (CRESTOR) 5 MG tablet Take 5 mg by mouth daily.     No current facility-administered medications on file prior to visit.    No Known Allergies  Physical exam:  Today's Vitals   06/27/21 1508  BP: 124/86  Pulse: 92  SpO2: 97%  Weight: 277 lb (125.6 kg)  Height: '5\' 11"'$  (1.803 m)   Body mass index is 38.63 kg/m.   Wt Readings from Last 3 Encounters:  06/27/21 277 lb (125.6 kg)  12/30/19 272 lb (123.4 kg)  12/16/19 272 lb (123.4 kg)     Ht Readings from Last 3 Encounters:  06/27/21 '5\' 11"'$  (1.803 m)  12/30/19 '5\' 10"'$  (1.778 m)  12/16/19 '5\' 10"'$  (1.778 m)      General: The patient is awake, alert and appears not in acute distress. The patient is well groomed. Head: Normocephalic, atraumatic. Neck is supple. Mallampati 3 ,  neck circumference:20 inches . Nasal airflow is patent.  Retrognathia is not seen.  Dental status:  Cardiovascular:  Regular rate and cardiac rhythm by pulse,  without distended neck veins. Respiratory: Lungs are clear to auscultation.  Skin:  Without evidence of ankle edema, or rash. Trunk: The patient's posture is erect.   Neurologic exam : The patient is awake and alert, oriented to place and time.   Memory subjective described as intact.  Attention span & concentration ability appears normal.  Speech is fluent,  without  dysarthria, dysphonia or aphasia.  Mood and affect are appropriate.   Cranial nerves: no loss of smell or taste reported  Pupils are equal and briskly reactive to light. Funduscopic exam deferred.  Extraocular movements in vertical and horizontal planes were intact and without nystagmus. No Diplopia. Visual  fields by finger perimetry are intact. Hearing was intact to soft voice and finger rubbing.    Facial sensation intact to fine touch.  Facial motor strength is symmetric and tongue and uvula move midline.  Neck ROM : rotation, tilt and flexion extension were normal for age and shoulder shrug was symmetrical.    Motor exam:  Symmetric bulk, tone and ROM.  able to provide equal shoulder shrug.  Normal tone without cog wheeling, symmetric grip strength . Sensory:  Fine touch, pinprick and vibration were tested  and  normal.  Proprioception tested in the upper extremities was normal. Coordination: Rapid alternating movements in the fingers/hands were of normal speed.  The Finger-to-nose maneuver was intact without evidence of ataxia, dysmetria or tremor. Gait and station: Patient could rise unassisted from a seated position, walked without assistive device.  Stance is of normal width/ base and the patient turned with 3 steps.  Toe and heel walk were deferred.  Deep tendon reflexes: in the  upper and lower extremities are symmetric and intact.  Babinski response was deferred.       After spending a total time of  45  minutes face to face and additional time for physical and neurologic examination, review of laboratory studies,  personal review of imaging studies, reports and results of other testing and review of referral information / records as far as provided in visit, I have established the following assessments:  1)  relatively long sleep time with less refreshing quality.  2)  snoring.  3)  Kicking in  his sleep, RLS ,  4)  history of anxiety attacks.    My Plan is to proceed with:  1)  [facility]) . 2)   RLS evaluation will need an attended sleep study. Current insurer is [employer], next months insured by [employer]- mat be more benefits.    I would like to thank Fanny Bien, MD and Fanny Bien, [address] [place],  [place] [address] for allowing me to meet with and to  take care of this pleasant patient.   I plan to follow up either personally or through our NP within 3-4 month.   CC: I will share my notes with PCP.   Electronically signed by: Larey Seat, MD 06/27/2021 3:10 PM  [facility] certified by The [employer] of Sleep Medicine and Diplomate of the [employer] of Sleep Medicine. Board certified In Neurology through the [employer], Fellow of the [employer] of Neurology. Medical Director of [employer].

## 2021-07-10 ENCOUNTER — Telehealth: Payer: Self-pay

## 2021-07-10 NOTE — Telephone Encounter (Signed)
LVM for patient to call me back. MD put note about pt getting new insurance. Need info from pt about when new insurance before starting auth process for sleep studies.

## 2021-07-23 ENCOUNTER — Telehealth: Payer: Self-pay

## 2021-07-23 NOTE — Telephone Encounter (Signed)
LVM for patient to call back and verify insurance

## 2021-07-26 DIAGNOSIS — H43822 Vitreomacular adhesion, left eye: Secondary | ICD-10-CM | POA: Diagnosis not present

## 2021-07-26 DIAGNOSIS — H43393 Other vitreous opacities, bilateral: Secondary | ICD-10-CM | POA: Diagnosis not present

## 2021-07-26 DIAGNOSIS — H43813 Vitreous degeneration, bilateral: Secondary | ICD-10-CM | POA: Diagnosis not present

## 2021-07-26 DIAGNOSIS — H31093 Other chorioretinal scars, bilateral: Secondary | ICD-10-CM | POA: Diagnosis not present

## 2021-08-02 DIAGNOSIS — Z23 Encounter for immunization: Secondary | ICD-10-CM | POA: Diagnosis not present

## 2021-08-07 ENCOUNTER — Telehealth: Payer: Self-pay

## 2021-08-07 NOTE — Telephone Encounter (Signed)
LVM for pt to call me back to verify insurance

## 2021-08-12 ENCOUNTER — Telehealth: Payer: Self-pay

## 2021-08-12 NOTE — Telephone Encounter (Signed)
We have attempted to call the patient 2 times to provide updated insurance information for sleep study. Patient has been unavailable at the phone numbers we have on file and has not returned our calls. If patient calls back we will obtain insurance information and begin auth & scheduling process.

## 2021-08-27 DIAGNOSIS — Z23 Encounter for immunization: Secondary | ICD-10-CM | POA: Diagnosis not present

## 2021-08-27 DIAGNOSIS — Z1211 Encounter for screening for malignant neoplasm of colon: Secondary | ICD-10-CM | POA: Diagnosis not present

## 2021-08-27 DIAGNOSIS — E782 Mixed hyperlipidemia: Secondary | ICD-10-CM | POA: Diagnosis not present

## 2021-08-27 DIAGNOSIS — R635 Abnormal weight gain: Secondary | ICD-10-CM | POA: Diagnosis not present

## 2021-08-27 DIAGNOSIS — I10 Essential (primary) hypertension: Secondary | ICD-10-CM | POA: Diagnosis not present

## 2021-08-27 DIAGNOSIS — R739 Hyperglycemia, unspecified: Secondary | ICD-10-CM | POA: Diagnosis not present

## 2021-08-27 DIAGNOSIS — Z0000 Encounter for general adult medical examination without abnormal findings: Secondary | ICD-10-CM | POA: Diagnosis not present

## 2021-09-19 DIAGNOSIS — R42 Dizziness and giddiness: Secondary | ICD-10-CM | POA: Diagnosis not present

## 2021-09-19 DIAGNOSIS — I10 Essential (primary) hypertension: Secondary | ICD-10-CM | POA: Diagnosis not present

## 2021-09-19 DIAGNOSIS — Z8241 Family history of sudden cardiac death: Secondary | ICD-10-CM | POA: Diagnosis not present

## 2021-09-19 DIAGNOSIS — Z8249 Family history of ischemic heart disease and other diseases of the circulatory system: Secondary | ICD-10-CM | POA: Diagnosis not present

## 2021-09-30 ENCOUNTER — Other Ambulatory Visit: Payer: Self-pay | Admitting: Family Medicine

## 2021-09-30 DIAGNOSIS — Z87891 Personal history of nicotine dependence: Secondary | ICD-10-CM

## 2021-09-30 DIAGNOSIS — E782 Mixed hyperlipidemia: Secondary | ICD-10-CM

## 2021-10-04 DIAGNOSIS — R42 Dizziness and giddiness: Secondary | ICD-10-CM | POA: Diagnosis not present

## 2021-10-04 DIAGNOSIS — I10 Essential (primary) hypertension: Secondary | ICD-10-CM | POA: Diagnosis not present

## 2021-10-09 DIAGNOSIS — I10 Essential (primary) hypertension: Secondary | ICD-10-CM | POA: Diagnosis not present

## 2021-10-25 ENCOUNTER — Ambulatory Visit
Admission: RE | Admit: 2021-10-25 | Discharge: 2021-10-25 | Disposition: A | Discharge status: Home / Self Care | Payer: No Typology Code available for payment source | Source: Ambulatory Visit | Attending: Family Medicine | Admitting: Family Medicine

## 2021-10-25 DIAGNOSIS — Z87891 Personal history of nicotine dependence: Secondary | ICD-10-CM

## 2021-10-25 DIAGNOSIS — E782 Mixed hyperlipidemia: Secondary | ICD-10-CM

## 2021-10-28 DIAGNOSIS — E782 Mixed hyperlipidemia: Secondary | ICD-10-CM | POA: Diagnosis not present

## 2021-10-28 DIAGNOSIS — R739 Hyperglycemia, unspecified: Secondary | ICD-10-CM | POA: Diagnosis not present

## 2021-10-28 DIAGNOSIS — M109 Gout, unspecified: Secondary | ICD-10-CM | POA: Diagnosis not present

## 2021-10-30 DIAGNOSIS — E782 Mixed hyperlipidemia: Secondary | ICD-10-CM | POA: Diagnosis not present

## 2021-10-30 DIAGNOSIS — I10 Essential (primary) hypertension: Secondary | ICD-10-CM | POA: Diagnosis not present

## 2021-10-30 DIAGNOSIS — E781 Pure hyperglyceridemia: Secondary | ICD-10-CM | POA: Diagnosis not present

## 2021-10-30 DIAGNOSIS — M109 Gout, unspecified: Secondary | ICD-10-CM | POA: Diagnosis not present

## 2021-11-11 ENCOUNTER — Other Ambulatory Visit: Payer: Self-pay

## 2021-11-11 ENCOUNTER — Encounter (HOSPITAL_BASED_OUTPATIENT_CLINIC_OR_DEPARTMENT_OTHER): Payer: Self-pay | Admitting: Cardiology

## 2021-11-11 ENCOUNTER — Ambulatory Visit (INDEPENDENT_AMBULATORY_CARE_PROVIDER_SITE_OTHER): Payer: BC Managed Care – PPO | Admitting: Cardiology

## 2021-11-11 VITALS — BP 118/92 | HR 75 | Ht 70.5 in | Wt 277.0 lb

## 2021-11-11 DIAGNOSIS — E8881 Metabolic syndrome: Secondary | ICD-10-CM | POA: Diagnosis not present

## 2021-11-11 DIAGNOSIS — Z7189 Other specified counseling: Secondary | ICD-10-CM

## 2021-11-11 DIAGNOSIS — Z713 Dietary counseling and surveillance: Secondary | ICD-10-CM

## 2021-11-11 DIAGNOSIS — Z7182 Exercise counseling: Secondary | ICD-10-CM

## 2021-11-11 DIAGNOSIS — I10 Essential (primary) hypertension: Secondary | ICD-10-CM | POA: Diagnosis not present

## 2021-11-11 DIAGNOSIS — Z8249 Family history of ischemic heart disease and other diseases of the circulatory system: Secondary | ICD-10-CM

## 2021-11-11 DIAGNOSIS — E782 Mixed hyperlipidemia: Secondary | ICD-10-CM

## 2021-11-11 DIAGNOSIS — I2510 Atherosclerotic heart disease of native coronary artery without angina pectoris: Secondary | ICD-10-CM | POA: Diagnosis not present

## 2021-11-11 MED ORDER — ASPIRIN EC 81 MG PO TBEC: 81.0000 mg | DELAYED_RELEASE_TABLET | Freq: Every day | ORAL | 3 refills | Status: DC

## 2021-11-11 NOTE — Progress Notes (Signed)
Cardiology Office Note:    Date:  11/11/2021   ID:  Jason Benitez, DOB [birth year], MRN [id number]  PCP:  Jason Bien, MD  Cardiologist:  Jason Dresser, MD  Referring MD: Jason Bien, MD   CC: new patient consultation for elevated calcium score, family history of ASCVD  History of Present Illness:    Jason Benitez is a 51 y.o. male with a hx of coronary calcium, hypertension and family history of CAD who is seen as a new consult at the request of Jason Bien, MD for the evaluation and management of cardiovascular risk management.  Note from Dr. Ernie Benitez dated 09/26/21 reviewed. Noted family history of ASCVD. Had coronary calcium score. Referred to cardiology for further evaluation.   Lipids from 08/22/21 show Tchol 205, TG 379, HDL 32, LDL 97  Cardiovascular risk factors: Prior clinical ASCVD: none Comorbid conditions: hypertension treated for 4-5 years, hyperlipidemia treated 4-5 years. Denies diabetes, chronic kidney disease  Metabolic syndrome/Obesity: BMI 39 Chronic inflammatory conditions: none Tobacco use history: former, quit ~1992. Quit dipping 5 years ago. Family history: maternal aunt had two strokes and two heart attacks (in her 34s), had cancer. Maternal uncle also had two heart attacks (first in his 55s, died of 2nd in his 34s). Maternal grandfather had MI and died around age 34. Mother is 23, has well controlled hypertension, no other cardiovascular issues. No siblings. Father's side: dad died age 34 of pancreatic cancer. Paternal uncle had MI, was a heavy smoker and obese. Prior pertinent testing and/or incidental findings: coronary calcium score reviewed Exercise level: largely sedentary, but can be active when needed. [place] Jason Benitez recently Current diet: avoids fried food, sweets. Tries to eat something green every day.  He is "conscious" of his heart, has had issues with high blood pressure before. He works on deep  breathing and relaxation to try to manage stress.   Denies chest pain, shortness of breath at rest or with normal exertion. No PND, orthopnea, LE edema or unexpected weight gain. No syncope or palpitations.   Past Medical History:  Diagnosis Date   ALLERGIC RHINITIS 05/03/2007   Allergy    Anemia    Anxiety    ASTHMA 05/03/2007   Asthma    allergy related   Chronic kidney disease    multiple kidney stones   Depression    DYSLIPIDEMIA 05/03/2007   EXOGENOUS OBESITY 05/03/2007   History of kidney stones    Hypertension    LOW BACK PAIN 03/30/2008   NEPHROLITHIASIS, HX OF 03/30/2008   Neuromuscular disorder (HCC)    restless legs    Past Surgical History:  Procedure Laterality Date   EXTRACORPOREAL SHOCK WAVE LITHOTRIPSY Left 05/14/2017   Procedure: EXTRACORPOREAL SHOCK WAVE LITHOTRIPSY (ESWL);  Surgeon: Irine Seal, MD;  Location: [facility];  Service: Urology;  Laterality: Left;   NO PAST SURGERIES      Current Medications: Current Outpatient Medications on File Prior to Visit  Medication Sig   albuterol (VENTOLIN HFA) 108 (90 Base) MCG/ACT inhaler Inhale 2 puffs into the lungs every 4 (four) hours as needed.   amLODipine (NORVASC) 10 MG tablet Take 10 mg by mouth daily.   buPROPion (WELLBUTRIN XL) 150 MG 24 hr tablet Take 150 mg by mouth every morning.   escitalopram (LEXAPRO) 20 MG tablet Take 20 mg by mouth daily.   fenofibrate 160 MG tablet Take 160 mg by mouth daily.   fluticasone (FLONASE) 50 MCG/ACT nasal spray  Place 2 sprays into the nose daily.   loratadine (CLARITIN) 10 MG tablet Take 10 mg by mouth daily.   montelukast (SINGULAIR) 10 MG tablet Take 10 mg by mouth daily.   rosuvastatin (CRESTOR) 20 MG tablet Take 20 mg by mouth at bedtime.   valsartan-hydrochlorothiazide (DIOVAN-HCT) 320-12.5 MG tablet Take 1 tablet by mouth every morning.   No current facility-administered medications on file prior to visit.     Allergies:   Patient has no known allergies.   Social  History   Tobacco Use   Smoking status: Former    Types: Cigarettes    Quit date: 10/20/1990    Years since quitting: 31.0   Smokeless tobacco: Never  Vaping Use   Vaping Use: Never used  Substance Use Topics   Alcohol use: Yes    Alcohol/week: 2.0 standard drinks    Types: 2 Cans of beer per week    Comment: 2x a week   Drug use: No    Family History: family history includes Cancer in his father and maternal grandfather; Diabetes in his father and mother; Hypertension in his mother. There is no history of Colon cancer, Esophageal cancer, Stomach cancer, or Rectal cancer.  ROS:   Please see the history of present illness.  Additional pertinent ROS: Constitutional: Negative for chills, fever, night sweats, unintentional weight loss  HENT: Negative for ear pain and hearing loss.   Eyes: Negative for loss of vision and eye pain.  Respiratory: Negative for cough, sputum, wheezing.   Cardiovascular: See HPI. Gastrointestinal: Negative for abdominal pain, melena, and hematochezia.  Genitourinary: Negative for dysuria and hematuria.  Musculoskeletal: Negative for falls and myalgias.  Skin: Negative for itching and rash.  Neurological: Negative for focal weakness, focal sensory changes and loss of consciousness.  Endo/Heme/Allergies: Does not bruise/bleed easily.     EKGs/Labs/Other Studies Reviewed:    The following studies were reviewed today: Calcium score 10/25/21 FINDINGS: CORONARY CALCIUM SCORES:   Left Main: 0   LAD: 77.1   LCx: 38.3   RCA: 14.5   Total Agatston Score: 130   MESA database percentile: 91   AORTA MEASUREMENTS:   Ascending Aorta: 40 mm   Descending Aorta: 27 mm   OTHER FINDINGS:   The heart size is within normal limits. No pericardial fluid is identified. The ascending thoracic aorta is mildly dilated measuring up to 4 cm in estimated maximal caliber. Visualized central pulmonary arteries are normal in caliber. Visualized mediastinum  and hilar regions demonstrate no lymphadenopathy or masses. Visualized lungs show no evidence of pulmonary edema, consolidation, pneumothorax, nodule or pleural fluid. Decreased attenuation of the liver is consistent with hepatic steatosis. Visualized bony structures are unremarkable.   IMPRESSION: 1. Coronary calcium score 130 is at the 91st percentile for the patient's age, sex and race. 2. Mild aneurysmal dilatation of the ascending thoracic aorta measuring up to approximately 4 cm in greatest diameter by unenhanced CT. Recommend annual imaging followup by CTA or MRA. This recommendation follows 2010 ACCF/AHA/AATS/ACR/ASA/SCA/SCAI/SIR/STS/SVM Guidelines for the Diagnosis and Management of Patients with Thoracic Aortic Disease. Circulation. 2010; 121: Y637-C588. Aortic aneurysm NOS (ICD10-I71.9) 3. Evidence of hepatic steatosis.  EKG:  EKG is personally reviewed.   11/11/21: NSR 75 bpm  Recent Labs: No results found for requested labs within last 8760 hours.  Recent Lipid Panel    Component Value Date/Time   CHOL 220 (H) 03/08/2013 0836   TRIG 191.0 (H) 03/08/2013 0836   HDL 26.70 (L) 03/08/2013 5027  CHOLHDL 8 03/08/2013 0836   VLDL 38.2 03/08/2013 0836   LDLDIRECT 125.2 03/08/2013 0836    Physical Exam:    VS:  BP (!) 118/92 (BP Location: Right Arm, Patient Position: Sitting, Cuff Size: Large)    Pulse 75    Ht 5' 10.5" (1.791 m)    Wt 277 lb (125.6 kg)    BMI 39.18 kg/m     Wt Readings from Last 3 Encounters:  11/11/21 277 lb (125.6 kg)  06/27/21 277 lb (125.6 kg)  12/30/19 272 lb (123.4 kg)    GEN: Well nourished, well developed in no acute distress HEENT: Normal, moist mucous membranes NECK: No JVD CARDIAC: regular rhythm, normal S1 and S2, no rubs or gallops. No murmur. VASCULAR: Radial and DP pulses 2+ bilaterally. No carotid bruits RESPIRATORY:  Clear to auscultation without rales, wheezing or rhonchi  ABDOMEN: Soft, non-tender,  non-distended MUSCULOSKELETAL:  Ambulates independently SKIN: Warm and dry, no edema NEUROLOGIC:  Alert and oriented x 3. No focal neuro deficits noted. PSYCHIATRIC:  Normal affect    ASSESSMENT:    1. Metabolic syndrome   2. Nonocclusive coronary atherosclerosis of native coronary artery   3. Essential hypertension   4. Mixed hyperlipidemia   5. Family history of heart disease   6. Cardiac risk counseling   7. Counseling on health promotion and disease prevention   8. Nutritional counseling   9. Exercise counseling    PLAN:    Coronary calcium, consistent with nonobstructive CAD Family history of ASCVD, especially on his mother's side We reviewed the calcium score at length, including actual images as well as the graph showing mortality based on calcium score. We discussed the pathophysiology of cholesterol plaque formation, the role of calcium and why it is a marker, how plaque is key to acute MI/CVA, and how known plaque is managed with medications.   -he is currently taking rosuvastatin, was on 10 mg rosuvastatin and was recently changed to 20 mg. Recommend rechecking in 2-3 mos post change, goal <70 LDL -we discussed aspirin today, he is willing to start. He will contact me with any bleeding  Hypertension -at goal today, continue amlodipine, valsartan-HCTZ  Mixed hyperlipidemia -he is currently taking rosuvastatin, was on 10 mg rosuvastatin and was recently changed to 20 mg. Recommend rechecking in 2-3 mos post change, goal <70 LDL -last TG 379, on fenofibrate -emphasized lifestyle  Metabolic syndrome: -has hepatic steatosis, dyslipidemia, hypertension -BMI 39 -no diabetes or kidney disease.  Cardiac risk counseling and prevention recommendations: -recommend heart healthy/Mediterranean diet, with whole grains, fruits, vegetable, fish, lean meats, nuts, and olive oil. Limit salt. -recommend moderate walking, 3-5 times/week for 30-50 minutes each session. Aim for at least  150 minutes.week. Goal should be pace of 3 miles/hours, or walking 1.5 miles in 30 minutes -recommend avoidance of tobacco products. Avoid excess alcohol. -ASCVD risk score: The ASCVD Risk score (Arnett DK, et al., 2019) failed to calculate for the following reasons:   Cannot find a previous HDL lab   Cannot find a previous total cholesterol lab    Plan for follow up: 2 years or sooner as needed  Jason Dresser, MD, PhD, [facility]    Medication Adjustments/Labs and Tests Ordered: Current medicines are reviewed at length with the patient today.  Concerns regarding medicines are outlined above.  Orders Placed This Encounter  Procedures   EKG 12-Lead   Meds ordered this encounter  Medications   aspirin EC 81 MG tablet  Sig: Take 1 tablet (81 mg total) by mouth daily. Swallow whole.    Dispense:  90 tablet    Refill:  3    Patient Instructions  Medication Instructions:  START: Aspirin 81 mg daily  *If you need a refill on your cardiac medications before your next appointment, please call your pharmacy*   Lab Work: None ordered today   Testing/Procedures: None ordered today   Follow-Up: At [facility], you and your health needs are our priority.  As part of our continuing mission to provide you with exceptional heart care, we have created designated Provider Care Teams.  These Care Teams include your primary Cardiologist (physician) and Advanced Practice Providers (APPs -  Physician Assistants and Nurse Practitioners) who all work together to provide you with the care you need, when you need it.  We recommend signing up for the patient portal called "MyChart".  Sign up information is provided on this After Visit Summary.  MyChart is used to connect with patients for Virtual Visits (Telemedicine).  Patients are able to view lab/test results, encounter notes, upcoming appointments, etc.  Non-urgent messages can be sent to your provider as well.    To learn more about what you can do with MyChart, go to [email or web address].    Your next appointment:   2 year(s)  The format for your next appointment:   In Person  Provider:   Buford Dresser, MD      Signed, Jason Dresser, MD PhD 11/11/2021 9:56 AM    [place]

## 2021-11-11 NOTE — Patient Instructions (Signed)
Medication Instructions:  START: Aspirin 81 mg daily  *If you need a refill on your cardiac medications before your next appointment, please call your pharmacy*   Lab Work: None ordered today   Testing/Procedures: None ordered today   Follow-Up: At [facility], you and your health needs are our priority.  As part of our continuing mission to provide you with exceptional heart care, we have created designated Provider Care Teams.  These Care Teams include your primary Cardiologist (physician) and Advanced Practice Providers (APPs -  Physician Assistants and Nurse Practitioners) who all work together to provide you with the care you need, when you need it.  We recommend signing up for the patient portal called "MyChart".  Sign up information is provided on this After Visit Summary.  MyChart is used to connect with patients for Virtual Visits (Telemedicine).  Patients are able to view lab/test results, encounter notes, upcoming appointments, etc.  Non-urgent messages can be sent to your provider as well.   To learn more about what you can do with MyChart, go to [email or web address].    Your next appointment:   2 year(s)  The format for your next appointment:   In Person  Provider:   Buford Dresser, MD

## 2021-12-27 DIAGNOSIS — M25512 Pain in left shoulder: Secondary | ICD-10-CM | POA: Diagnosis not present

## 2021-12-27 DIAGNOSIS — M542 Cervicalgia: Secondary | ICD-10-CM | POA: Diagnosis not present

## 2022-01-06 ENCOUNTER — Other Ambulatory Visit: Payer: Self-pay | Admitting: Nurse Practitioner

## 2022-01-06 ENCOUNTER — Ambulatory Visit
Admission: RE | Admit: 2022-01-06 | Discharge: 2022-01-06 | Disposition: A | Discharge status: Home / Self Care | Payer: BC Managed Care – PPO | Source: Ambulatory Visit | Attending: Nurse Practitioner | Admitting: Nurse Practitioner

## 2022-01-06 DIAGNOSIS — M542 Cervicalgia: Secondary | ICD-10-CM

## 2022-01-06 DIAGNOSIS — W108XXA Fall (on) (from) other stairs and steps, initial encounter: Secondary | ICD-10-CM

## 2022-01-06 DIAGNOSIS — R200 Anesthesia of skin: Secondary | ICD-10-CM | POA: Diagnosis not present

## 2022-01-13 DIAGNOSIS — M5030 Other cervical disc degeneration, unspecified cervical region: Secondary | ICD-10-CM | POA: Diagnosis not present

## 2022-01-21 DIAGNOSIS — M542 Cervicalgia: Secondary | ICD-10-CM | POA: Diagnosis not present

## 2022-01-21 DIAGNOSIS — M25512 Pain in left shoulder: Secondary | ICD-10-CM | POA: Diagnosis not present

## 2022-01-21 DIAGNOSIS — M25511 Pain in right shoulder: Secondary | ICD-10-CM | POA: Diagnosis not present

## 2022-01-27 DIAGNOSIS — M25512 Pain in left shoulder: Secondary | ICD-10-CM | POA: Diagnosis not present

## 2022-01-27 DIAGNOSIS — M25511 Pain in right shoulder: Secondary | ICD-10-CM | POA: Diagnosis not present

## 2022-01-27 DIAGNOSIS — M542 Cervicalgia: Secondary | ICD-10-CM | POA: Diagnosis not present

## 2022-02-03 DIAGNOSIS — M542 Cervicalgia: Secondary | ICD-10-CM | POA: Diagnosis not present

## 2022-02-03 DIAGNOSIS — M25511 Pain in right shoulder: Secondary | ICD-10-CM | POA: Diagnosis not present

## 2022-02-03 DIAGNOSIS — M25512 Pain in left shoulder: Secondary | ICD-10-CM | POA: Diagnosis not present

## 2022-02-10 DIAGNOSIS — E1122 Type 2 diabetes mellitus with diabetic chronic kidney disease: Secondary | ICD-10-CM | POA: Diagnosis not present

## 2022-02-10 DIAGNOSIS — M542 Cervicalgia: Secondary | ICD-10-CM | POA: Diagnosis not present

## 2022-02-10 DIAGNOSIS — E782 Mixed hyperlipidemia: Secondary | ICD-10-CM | POA: Diagnosis not present

## 2022-02-17 DIAGNOSIS — M25511 Pain in right shoulder: Secondary | ICD-10-CM | POA: Diagnosis not present

## 2022-02-17 DIAGNOSIS — M542 Cervicalgia: Secondary | ICD-10-CM | POA: Diagnosis not present

## 2022-02-17 DIAGNOSIS — E1165 Type 2 diabetes mellitus with hyperglycemia: Secondary | ICD-10-CM | POA: Diagnosis not present

## 2022-02-17 DIAGNOSIS — G4700 Insomnia, unspecified: Secondary | ICD-10-CM | POA: Diagnosis not present

## 2022-02-17 DIAGNOSIS — E782 Mixed hyperlipidemia: Secondary | ICD-10-CM | POA: Diagnosis not present

## 2022-02-17 DIAGNOSIS — I10 Essential (primary) hypertension: Secondary | ICD-10-CM | POA: Diagnosis not present

## 2022-02-17 DIAGNOSIS — M25512 Pain in left shoulder: Secondary | ICD-10-CM | POA: Diagnosis not present

## 2022-03-03 DIAGNOSIS — M25512 Pain in left shoulder: Secondary | ICD-10-CM | POA: Diagnosis not present

## 2022-03-03 DIAGNOSIS — M25511 Pain in right shoulder: Secondary | ICD-10-CM | POA: Diagnosis not present

## 2022-03-03 DIAGNOSIS — M542 Cervicalgia: Secondary | ICD-10-CM | POA: Diagnosis not present

## 2022-04-28 DIAGNOSIS — I10 Essential (primary) hypertension: Secondary | ICD-10-CM | POA: Diagnosis not present

## 2022-04-28 DIAGNOSIS — M109 Gout, unspecified: Secondary | ICD-10-CM | POA: Diagnosis not present

## 2022-04-28 DIAGNOSIS — E559 Vitamin D deficiency, unspecified: Secondary | ICD-10-CM | POA: Diagnosis not present

## 2022-04-28 DIAGNOSIS — E782 Mixed hyperlipidemia: Secondary | ICD-10-CM | POA: Diagnosis not present

## 2022-05-05 DIAGNOSIS — G4700 Insomnia, unspecified: Secondary | ICD-10-CM | POA: Diagnosis not present

## 2022-05-05 DIAGNOSIS — E782 Mixed hyperlipidemia: Secondary | ICD-10-CM | POA: Diagnosis not present

## 2022-05-05 DIAGNOSIS — I10 Essential (primary) hypertension: Secondary | ICD-10-CM | POA: Diagnosis not present

## 2022-05-05 DIAGNOSIS — E559 Vitamin D deficiency, unspecified: Secondary | ICD-10-CM | POA: Diagnosis not present

## 2022-05-09 ENCOUNTER — Emergency Department (HOSPITAL_BASED_OUTPATIENT_CLINIC_OR_DEPARTMENT_OTHER)
Admission: EM | Admit: 2022-05-09 | Discharge: 2022-05-09 | Disposition: A | Discharge status: Home / Self Care | Payer: BC Managed Care – PPO | Attending: Emergency Medicine | Admitting: Emergency Medicine

## 2022-05-09 ENCOUNTER — Other Ambulatory Visit: Payer: Self-pay

## 2022-05-09 ENCOUNTER — Encounter (HOSPITAL_BASED_OUTPATIENT_CLINIC_OR_DEPARTMENT_OTHER): Payer: Self-pay | Admitting: Emergency Medicine

## 2022-05-09 DIAGNOSIS — Z79899 Other long term (current) drug therapy: Secondary | ICD-10-CM | POA: Insufficient documentation

## 2022-05-09 DIAGNOSIS — I10 Essential (primary) hypertension: Secondary | ICD-10-CM | POA: Diagnosis not present

## 2022-05-09 DIAGNOSIS — Z7982 Long term (current) use of aspirin: Secondary | ICD-10-CM | POA: Insufficient documentation

## 2022-05-09 LAB — CBC
HCT: 45.1 % (ref 39.0–52.0)
Hemoglobin: 15.5 g/dL (ref 13.0–17.0)
MCH: 29.5 pg (ref 26.0–34.0)
MCHC: 34.4 g/dL (ref 30.0–36.0)
MCV: 85.9 fL (ref 80.0–100.0)
Platelets: 239 10*3/uL (ref 150–400)
RBC: 5.25 MIL/uL (ref 4.22–5.81)
RDW: 13.8 % (ref 11.5–15.5)
WBC: 6.6 10*3/uL (ref 4.0–10.5)
nRBC: 0 % (ref 0.0–0.2)

## 2022-05-09 LAB — BASIC METABOLIC PANEL
Anion gap: 11 (ref 5–15)
BUN: 23 mg/dL — ABNORMAL HIGH (ref 6–20)
CO2: 27 mmol/L (ref 22–32)
Calcium: 10 mg/dL (ref 8.9–10.3)
Chloride: 99 mmol/L (ref 98–111)
Creatinine, Ser: 1.32 mg/dL — ABNORMAL HIGH (ref 0.61–1.24)
GFR, Estimated: >60 mL/min (ref >60)
Glucose, Bld: 112 mg/dL — ABNORMAL HIGH (ref 70–99)
Potassium: 3.7 mmol/L (ref 3.5–5.1)
Sodium: 137 mmol/L (ref 135–145)

## 2022-05-09 LAB — TROPONIN I (HIGH SENSITIVITY)
Troponin I (High Sensitivity): 3 ng/L (ref <18)
Troponin I (High Sensitivity): 3 ng/L (ref <18)

## 2022-05-09 NOTE — ED Notes (Signed)
Discharge instructions and follow up care reviewed and explained, pt verbalized understanding and had no further questions on d/c.  

## 2022-05-09 NOTE — ED Provider Notes (Signed)
[facility] EMERGENCY DEPT Provider Note   CSN: [id number] Arrival date & time: 05/09/22  1253     History  Chief Complaint  Patient presents with   Hypertension    Jason Benitez is a 51 y.o. male.  Patient reports his blood pressure has been elevated for the past week.  Patient spoke to his primary care doctor who has recently increased his blood pressure medicines.  Patient called his physician today when his numbers were elevated and they told him to come to the emergency department.  Patient reports he has recently been traveling and admits to eating a lot of fast food and what ever.  Patient had a coronary calcium score in the 91st percentile recently.  He is currently on Norvasc and Diovan HCT Z.  The history is provided by the patient. No language interpreter was used.  Hypertension This is a new problem. The current episode started more than 1 week ago. The problem occurs constantly. The problem has been gradually worsening. Pertinent negatives include no chest pain and no headaches. Nothing aggravates the symptoms. Nothing relieves the symptoms. He has tried nothing for the symptoms. The treatment provided no relief.  He is currently on no Norvasc     Home Medications Prior to Admission medications   Medication Sig Start Date End Date Taking? Authorizing Provider  albuterol (VENTOLIN HFA) 108 (90 Base) MCG/ACT inhaler Inhale 2 puffs into the lungs every 4 (four) hours as needed. 08/13/19   [provider]  amLODipine (NORVASC) 10 MG tablet Take 10 mg by mouth daily. 10/21/21   [provider]  aspirin EC 81 MG tablet Take 1 tablet (81 mg total) by mouth daily. Swallow whole. 11/11/21   Buford Dresser, MD  buPROPion (WELLBUTRIN XL) 150 MG 24 hr tablet Take 150 mg by mouth every morning. 12/09/19   [provider]  escitalopram (LEXAPRO) 20 MG tablet Take 20 mg by mouth daily. 12/03/19   [provider]  fenofibrate 160  MG tablet Take 160 mg by mouth daily. 12/03/19   [provider]  fluticasone (FLONASE) 50 MCG/ACT nasal spray Place 2 sprays into the nose daily. 03/15/13   Marletta Lor, MD  loratadine (CLARITIN) 10 MG tablet Take 10 mg by mouth daily.    [provider]  montelukast (SINGULAIR) 10 MG tablet Take 10 mg by mouth daily. 12/03/19   [provider]  rosuvastatin (CRESTOR) 20 MG tablet Take 20 mg by mouth at bedtime. 11/01/21   [provider]  valsartan-hydrochlorothiazide (DIOVAN-HCT) 320-12.5 MG tablet Take 1 tablet by mouth every morning. 10/30/21   [provider]      Allergies    Patient has no known allergies.    Review of Systems   Review of Systems  Cardiovascular:  Negative for chest pain.  Neurological:  Negative for headaches.  All other systems reviewed and are negative.   Physical Exam Updated Vital Signs BP 122/71   Pulse 100   Temp 98.4 F (36.9 C) (Oral)   Resp (!) 21   Ht '5\' 10"'$  (1.778 m)   Wt 124.7 kg   SpO2 93%   BMI 39.46 kg/m  Physical Exam Vitals and nursing note reviewed.  Constitutional:      Appearance: He is well-developed.  HENT:     Head: Normocephalic.  Cardiovascular:     Rate and Rhythm: Normal rate.  Pulmonary:     Effort: Pulmonary effort is normal.  Abdominal:  General: There is no distension.  Musculoskeletal:        General: Normal range of motion.     Cervical back: Normal range of motion.  Skin:    General: Skin is warm.  Neurological:     General: No focal deficit present.     Mental Status: He is alert and oriented to person, place, and time.  Psychiatric:        Mood and Affect: Mood normal.     ED Results / Procedures / Treatments   Labs (all labs ordered are listed, but only abnormal results are displayed) Labs Reviewed  BASIC METABOLIC PANEL - Abnormal; Notable for the following components:      Result Value   Glucose, Bld 112 (*)    BUN 23 (*)    Creatinine,  Ser 1.32 (*)    All other components within normal limits  CBC  TROPONIN I (HIGH SENSITIVITY)  TROPONIN I (HIGH SENSITIVITY)    EKG EKG Interpretation  Date/Time:  Friday May 09 2022 13:07:01 EDT Ventricular Rate:  92 PR Interval:  152 QRS Duration: 86 QT Interval:  366 QTC Calculation: 452 R Axis:   47 Text Interpretation: Normal sinus rhythm T wave abnormality, consider inferior ischemia Abnormal ECG When compared with ECG of 21-Jan-2016 20:28, PREVIOUS ECG IS PRESENT when compared to prior, similar appearance. No STEMI Confirmed by Antony Blackbird [phone]) on 05/09/2022 5:38:56 PM  Radiology No results found.  Procedures Procedures    Medications Ordered in ED Medications - No data to display  ED Course/ Medical Decision Making/ A&P                           Medical Decision Making Patient reports he has had hypertension for the past week  Amount and/or Complexity of Data Reviewed Independent Historian: spouse    Details: Patient is here with his spouse who is supportive External Data Reviewed: notes.    Details: Primary care and cardiology notes reviewed Labs: ordered. Decision-making details documented in ED Course.    Details: Labs ordered reviewed and interpreted.  Patient has normal renal function ECG/medicine tests: ordered and independent interpretation performed. Decision-making details documented in ED Course.    Details: EKG shows some changes since previous study in April no acute changes  Risk Risk Details: Patient is hypertensive on his evaluation I obtain troponin x2 as patient has had EKG changes he has an elevated calcium score.  Patient's blood pressure did normalize while he was in the emergency department.  I discussed lifestyle changes with patient including DASH diet.  I advised patient I feel like he needs to follow-up with cardiology.           Final Clinical Impression(s) / ED Diagnoses Final diagnoses:  Hypertension, unspecified  type    Rx / DC Orders ED Discharge Orders     None      An After Visit Summary was printed and given to the patient.    Fransico Meadow, [place] 05/09/22 1759    Tegeler, Gwenyth Allegra, MD 05/09/22 [phone]

## 2022-05-09 NOTE — ED Triage Notes (Signed)
Pt arrives to ED with c/o HTN. Pt reports over the past week his BP has been high. Associated symptoms include headaches and neck pain. Pt reports his GP increased his Diovan HCT this week w/o improvement.

## 2022-05-09 NOTE — Discharge Instructions (Addendum)
Follow up with your cardiologist and your primary care physicain for recheck

## 2022-05-12 DIAGNOSIS — G4700 Insomnia, unspecified: Secondary | ICD-10-CM | POA: Diagnosis not present

## 2022-05-12 DIAGNOSIS — I10 Essential (primary) hypertension: Secondary | ICD-10-CM | POA: Diagnosis not present

## 2022-05-12 DIAGNOSIS — F411 Generalized anxiety disorder: Secondary | ICD-10-CM | POA: Diagnosis not present

## 2022-06-02 DIAGNOSIS — I10 Essential (primary) hypertension: Secondary | ICD-10-CM | POA: Diagnosis not present

## 2022-06-02 DIAGNOSIS — G4700 Insomnia, unspecified: Secondary | ICD-10-CM | POA: Diagnosis not present

## 2022-06-02 DIAGNOSIS — F331 Major depressive disorder, recurrent, moderate: Secondary | ICD-10-CM | POA: Diagnosis not present

## 2022-06-02 DIAGNOSIS — F411 Generalized anxiety disorder: Secondary | ICD-10-CM | POA: Diagnosis not present

## 2022-07-14 DIAGNOSIS — E782 Mixed hyperlipidemia: Secondary | ICD-10-CM | POA: Diagnosis not present

## 2022-09-05 DIAGNOSIS — U071 COVID-19: Secondary | ICD-10-CM | POA: Diagnosis not present

## 2022-10-24 DIAGNOSIS — Z114 Encounter for screening for human immunodeficiency virus [HIV]: Secondary | ICD-10-CM | POA: Diagnosis not present

## 2022-10-24 DIAGNOSIS — E782 Mixed hyperlipidemia: Secondary | ICD-10-CM | POA: Diagnosis not present

## 2022-10-24 DIAGNOSIS — Z13220 Encounter for screening for lipoid disorders: Secondary | ICD-10-CM | POA: Diagnosis not present

## 2022-10-24 DIAGNOSIS — Z125 Encounter for screening for malignant neoplasm of prostate: Secondary | ICD-10-CM | POA: Diagnosis not present

## 2022-10-24 DIAGNOSIS — Z0000 Encounter for general adult medical examination without abnormal findings: Secondary | ICD-10-CM | POA: Diagnosis not present

## 2022-10-28 DIAGNOSIS — I10 Essential (primary) hypertension: Secondary | ICD-10-CM | POA: Diagnosis not present

## 2022-10-28 DIAGNOSIS — Z0000 Encounter for general adult medical examination without abnormal findings: Secondary | ICD-10-CM | POA: Diagnosis not present

## 2022-10-28 DIAGNOSIS — Z23 Encounter for immunization: Secondary | ICD-10-CM | POA: Diagnosis not present

## 2022-10-28 DIAGNOSIS — Z1211 Encounter for screening for malignant neoplasm of colon: Secondary | ICD-10-CM | POA: Diagnosis not present

## 2022-10-28 DIAGNOSIS — R7989 Other specified abnormal findings of blood chemistry: Secondary | ICD-10-CM | POA: Diagnosis not present

## 2022-11-03 DIAGNOSIS — I10 Essential (primary) hypertension: Secondary | ICD-10-CM | POA: Diagnosis not present

## 2022-11-03 DIAGNOSIS — R739 Hyperglycemia, unspecified: Secondary | ICD-10-CM | POA: Diagnosis not present

## 2022-11-18 DIAGNOSIS — I10 Essential (primary) hypertension: Secondary | ICD-10-CM | POA: Diagnosis not present

## 2023-03-17 DIAGNOSIS — J45901 Unspecified asthma with (acute) exacerbation: Secondary | ICD-10-CM | POA: Diagnosis not present

## 2023-03-17 DIAGNOSIS — J069 Acute upper respiratory infection, unspecified: Secondary | ICD-10-CM | POA: Diagnosis not present

## 2023-03-17 DIAGNOSIS — Z1159 Encounter for screening for other viral diseases: Secondary | ICD-10-CM | POA: Diagnosis not present

## 2023-07-24 DIAGNOSIS — I10 Essential (primary) hypertension: Secondary | ICD-10-CM | POA: Diagnosis not present

## 2023-07-24 DIAGNOSIS — J4540 Moderate persistent asthma, uncomplicated: Secondary | ICD-10-CM | POA: Diagnosis not present

## 2023-07-24 DIAGNOSIS — G4700 Insomnia, unspecified: Secondary | ICD-10-CM | POA: Diagnosis not present

## 2023-07-24 DIAGNOSIS — F411 Generalized anxiety disorder: Secondary | ICD-10-CM | POA: Diagnosis not present

## 2023-08-11 DIAGNOSIS — E785 Hyperlipidemia, unspecified: Secondary | ICD-10-CM | POA: Diagnosis not present

## 2023-08-11 DIAGNOSIS — R739 Hyperglycemia, unspecified: Secondary | ICD-10-CM | POA: Diagnosis not present

## 2023-08-14 DIAGNOSIS — E782 Mixed hyperlipidemia: Secondary | ICD-10-CM | POA: Diagnosis not present

## 2023-08-14 DIAGNOSIS — Z789 Other specified health status: Secondary | ICD-10-CM | POA: Diagnosis not present

## 2023-08-14 DIAGNOSIS — I2510 Atherosclerotic heart disease of native coronary artery without angina pectoris: Secondary | ICD-10-CM | POA: Diagnosis not present

## 2023-08-14 DIAGNOSIS — R7303 Prediabetes: Secondary | ICD-10-CM | POA: Diagnosis not present

## 2023-09-10 DIAGNOSIS — Z789 Other specified health status: Secondary | ICD-10-CM | POA: Diagnosis not present

## 2023-09-10 DIAGNOSIS — Z6836 Body mass index (BMI) 36.0-36.9, adult: Secondary | ICD-10-CM | POA: Diagnosis not present

## 2023-09-10 DIAGNOSIS — I10 Essential (primary) hypertension: Secondary | ICD-10-CM | POA: Diagnosis not present

## 2023-09-28 DIAGNOSIS — G4733 Obstructive sleep apnea (adult) (pediatric): Secondary | ICD-10-CM | POA: Diagnosis not present

## 2023-10-29 DIAGNOSIS — G4733 Obstructive sleep apnea (adult) (pediatric): Secondary | ICD-10-CM | POA: Diagnosis not present

## 2023-11-09 DIAGNOSIS — Z13220 Encounter for screening for lipoid disorders: Secondary | ICD-10-CM | POA: Diagnosis not present

## 2023-11-09 DIAGNOSIS — Z0000 Encounter for general adult medical examination without abnormal findings: Secondary | ICD-10-CM | POA: Diagnosis not present

## 2023-11-13 DIAGNOSIS — I77810 Thoracic aortic ectasia: Secondary | ICD-10-CM | POA: Diagnosis not present

## 2023-11-13 DIAGNOSIS — Z125 Encounter for screening for malignant neoplasm of prostate: Secondary | ICD-10-CM | POA: Diagnosis not present

## 2023-11-13 DIAGNOSIS — Z0000 Encounter for general adult medical examination without abnormal findings: Secondary | ICD-10-CM | POA: Diagnosis not present

## 2023-11-13 DIAGNOSIS — R351 Nocturia: Secondary | ICD-10-CM | POA: Diagnosis not present

## 2023-11-13 DIAGNOSIS — R7303 Prediabetes: Secondary | ICD-10-CM | POA: Diagnosis not present

## 2023-11-14 LAB — LAB REPORT - SCANNED: PSA, Total: 0.3

## 2023-11-25 ENCOUNTER — Other Ambulatory Visit (HOSPITAL_BASED_OUTPATIENT_CLINIC_OR_DEPARTMENT_OTHER): Payer: Self-pay

## 2023-11-25 ENCOUNTER — Ambulatory Visit: Payer: BC Managed Care – PPO | Admitting: Internal Medicine

## 2023-11-25 ENCOUNTER — Encounter: Payer: Self-pay | Admitting: Internal Medicine

## 2023-11-25 ENCOUNTER — Other Ambulatory Visit: Payer: Self-pay

## 2023-11-25 ENCOUNTER — Telehealth: Payer: Self-pay | Admitting: Internal Medicine

## 2023-11-25 ENCOUNTER — Encounter (HOSPITAL_BASED_OUTPATIENT_CLINIC_OR_DEPARTMENT_OTHER): Payer: Self-pay

## 2023-11-25 VITALS — BP 132/82 | HR 75 | Temp 97.9°F | Ht 70.0 in | Wt 278.2 lb

## 2023-11-25 DIAGNOSIS — K589 Irritable bowel syndrome without diarrhea: Secondary | ICD-10-CM | POA: Insufficient documentation

## 2023-11-25 DIAGNOSIS — Z8669 Personal history of other diseases of the nervous system and sense organs: Secondary | ICD-10-CM

## 2023-11-25 DIAGNOSIS — M5030 Other cervical disc degeneration, unspecified cervical region: Secondary | ICD-10-CM

## 2023-11-25 DIAGNOSIS — T560X1S Toxic effect of lead and its compounds, accidental (unintentional), sequela: Secondary | ICD-10-CM

## 2023-11-25 DIAGNOSIS — K760 Fatty (change of) liver, not elsewhere classified: Secondary | ICD-10-CM

## 2023-11-25 DIAGNOSIS — T7840XD Allergy, unspecified, subsequent encounter: Secondary | ICD-10-CM

## 2023-11-25 DIAGNOSIS — T7840XA Allergy, unspecified, initial encounter: Secondary | ICD-10-CM | POA: Insufficient documentation

## 2023-11-25 DIAGNOSIS — M109 Gout, unspecified: Secondary | ICD-10-CM | POA: Insufficient documentation

## 2023-11-25 DIAGNOSIS — E781 Pure hyperglyceridemia: Secondary | ICD-10-CM

## 2023-11-25 DIAGNOSIS — I2510 Atherosclerotic heart disease of native coronary artery without angina pectoris: Secondary | ICD-10-CM | POA: Insufficient documentation

## 2023-11-25 DIAGNOSIS — E8881 Metabolic syndrome: Secondary | ICD-10-CM

## 2023-11-25 DIAGNOSIS — M1A1710 Lead-induced chronic gout, right ankle and foot, without tophus (tophi): Secondary | ICD-10-CM

## 2023-11-25 DIAGNOSIS — G2581 Restless legs syndrome: Secondary | ICD-10-CM

## 2023-11-25 DIAGNOSIS — I77810 Thoracic aortic ectasia: Secondary | ICD-10-CM | POA: Insufficient documentation

## 2023-11-25 DIAGNOSIS — N200 Calculus of kidney: Secondary | ICD-10-CM

## 2023-11-25 DIAGNOSIS — M7541 Impingement syndrome of right shoulder: Secondary | ICD-10-CM | POA: Insufficient documentation

## 2023-11-25 DIAGNOSIS — I729 Aneurysm of unspecified site: Secondary | ICD-10-CM | POA: Insufficient documentation

## 2023-11-25 DIAGNOSIS — D229 Melanocytic nevi, unspecified: Secondary | ICD-10-CM

## 2023-11-25 DIAGNOSIS — Z87442 Personal history of urinary calculi: Secondary | ICD-10-CM

## 2023-11-25 DIAGNOSIS — E65 Localized adiposity: Secondary | ICD-10-CM | POA: Diagnosis not present

## 2023-11-25 DIAGNOSIS — I10 Essential (primary) hypertension: Secondary | ICD-10-CM

## 2023-11-25 DIAGNOSIS — F5101 Primary insomnia: Secondary | ICD-10-CM

## 2023-11-25 DIAGNOSIS — R7303 Prediabetes: Secondary | ICD-10-CM

## 2023-11-25 DIAGNOSIS — F419 Anxiety disorder, unspecified: Secondary | ICD-10-CM | POA: Diagnosis not present

## 2023-11-25 DIAGNOSIS — R9431 Abnormal electrocardiogram [ECG] [EKG]: Secondary | ICD-10-CM | POA: Insufficient documentation

## 2023-11-25 DIAGNOSIS — G4700 Insomnia, unspecified: Secondary | ICD-10-CM

## 2023-11-25 DIAGNOSIS — G4733 Obstructive sleep apnea (adult) (pediatric): Secondary | ICD-10-CM

## 2023-11-25 DIAGNOSIS — Z87448 Personal history of other diseases of urinary system: Secondary | ICD-10-CM | POA: Insufficient documentation

## 2023-11-25 DIAGNOSIS — R7989 Other specified abnormal findings of blood chemistry: Secondary | ICD-10-CM | POA: Insufficient documentation

## 2023-11-25 DIAGNOSIS — E782 Mixed hyperlipidemia: Secondary | ICD-10-CM

## 2023-11-25 DIAGNOSIS — K580 Irritable bowel syndrome with diarrhea: Secondary | ICD-10-CM

## 2023-11-25 DIAGNOSIS — Z8679 Personal history of other diseases of the circulatory system: Secondary | ICD-10-CM | POA: Insufficient documentation

## 2023-11-25 DIAGNOSIS — K219 Gastro-esophageal reflux disease without esophagitis: Secondary | ICD-10-CM | POA: Insufficient documentation

## 2023-11-25 HISTORY — DX: Insomnia, unspecified: G47.00

## 2023-11-25 MED ORDER — TIRZEPATIDE-WEIGHT MANAGEMENT 2.5 MG/0.5ML ~~LOC~~ SOAJ
2.5000 mg | SUBCUTANEOUS | 11 refills | Status: DC
  Filled 2023-11-25 (×3): qty 2, 28d supply, fill #0

## 2023-11-25 MED ORDER — ROPINIROLE HCL 0.25 MG PO TABS: 0.2500 mg | ORAL_TABLET | Freq: Three times a day (TID) | ORAL | 4 refills | Status: DC

## 2023-11-25 NOTE — Telephone Encounter (Signed)
 Routing to clinical staff for review. FYI  Copied from CRM [id number]. Topic: Clinical - Prescription Issue >> Nov 25, 2023  1:13 PM Montie POUR wrote: Reason for CRM: Pharmacy has submitted the prior authorization for tirzepatide  (ZEPBOUND ) 2.5 MG/0.5ML Pen. Chyrl wanted Dr. Jesus to know.

## 2023-11-25 NOTE — Patient Instructions (Addendum)
 It was a pleasure seeing you today! Your health and satisfaction are our top priorities.  Bernardino Cone, MD  Trial 2 weeks lactose free and/or 2 weeks gluten free. Then trial standard IBS info  VISIT SUMMARY:  During today's visit, we reviewed your ongoing health conditions and made several adjustments to your treatment plan. We discussed your obstructive sleep apnea, hypertension, coronary artery disease, mixed hyperlipidemia, anxiety, restless legs syndrome, and irritable bowel syndrome. We also covered general health maintenance and follow-up plans.  YOUR PLAN:  -OBSTRUCTIVE SLEEP APNEA: Obstructive sleep apnea is a condition where your breathing stops and starts during sleep. You are currently using a CPAP machine, which is effective. We discussed starting tirzepatide  (Zepbound ) for weight loss, which may improve your sleep apnea and cardiovascular health. You will start with a dose of 2.5 mg, and we will follow up in one month to assess its efficacy and any side effects. Continue using your CPAP machine.  -HYPERTENSION: Hypertension, or high blood pressure, is being managed with amlodipine  and valsartan . Your blood pressure readings at home are stable. Continue taking your current medications and monitor your blood pressure regularly at home. We will request your previous medical records to ensure comprehensive care.  -CORONARY ARTERY DISEASE: Coronary artery disease involves the narrowing or blockage of the coronary arteries. Your calcium  score is between 100-200, and a previous EKG showed inferior ischemia. We will continue with annual imaging to monitor the disease. Please be vigilant for any symptoms like chest pain or shortness of breath.  -MIXED HYPERLIPIDEMIA: Mixed hyperlipidemia is a condition with high levels of triglycerides and low HDL cholesterol. Your triglycerides have improved but are still elevated. Continue your lipid-lowering therapy and make lifestyle changes to increase HDL.  We will review your recent lab results to adjust your treatment as needed.  -ANXIETY: Anxiety is being managed with medication, which you find effective. Continue taking your medication as prescribed and monitor for any changes in your symptoms. Regular follow-up is important to manage this condition effectively.  -RESTLESS LEGS SYNDROME: Restless legs syndrome causes uncomfortable sensations in your legs, especially at night. We discussed starting Requip  to help with these symptoms. You will begin with a low dose, and we will monitor for improvement and any side effects.  -IRRITABLE BOWEL SYNDROME (IBS): IBS is a gastrointestinal disorder causing symptoms like diarrhea. We discussed dietary modifications to identify triggers. Start with a two-week lactose-free diet, and if there is no improvement, try a two-week gluten-free diet. We provided educational materials to help with these changes.  -GENERAL HEALTH MAINTENANCE: We discussed general health maintenance, including flu vaccination and health screenings. Due to seasonality and upcoming travel, we decided against the flu vaccination at this time. We will request your medical records from previous providers and review your recent lab results.  INSTRUCTIONS:  Please follow up in one month to assess the efficacy and side effects of tirzepatide . Sign the release for lab results and request [facility] to forward your lab results. Additionally, we will refer you to a new eye doctor for follow-up on your retinal detachment.   Your Providers PCP: Cone Bernardino MATSU, MD,  [phone]) Referring Provider: Waylan Almarie SAUNDERS, MD,  [phone]) Care Team Provider: Lonni Slain, MD,  [phone])     NEXT STEPS: [x]  Early Intervention: Schedule sooner appointment, call our on-call services, or go to emergency room if there is any significant Increase in pain or discomfort New or worsening symptoms Sudden or severe changes in your  health [x]   Flexible Follow-Up: We recommend a No follow-ups on file. for optimal routine care. This allows for progress monitoring and treatment adjustments. [x]  Preventive Care: Schedule your annual preventive care visit! It's typically covered by insurance and helps identify potential health issues early. [x]  Lab & X-ray Appointments: Incomplete tests scheduled today, or call to schedule. X-rays: [facility] (M-F, 8:30am-noon or 1pm-5pm). [x]  Medical Information Release: Sign a release form at front desk to obtain relevant medical information we don't have.  MAKING THE MOST OF OUR FOCUSED 20 MINUTE APPOINTMENTS: [x]   Clearly state your top concerns at the beginning of the visit to focus our discussion [x]   If you anticipate you will need more time, please inform the front desk during scheduling - we can book multiple appointments in the same week. [x]   If you have transportation problems- use our convenient video appointments or ask about transportation support. [x]   We can get down to business faster if you use MyChart to update information before the visit and submit non-urgent questions before your visit. Thank you for taking the time to provide details through MyChart.  Let our nurse know and she can import this information into your encounter documents.  Arrival and Wait Times: [x]   Arriving on time ensures that everyone receives prompt attention. [x]   Early morning (8a) and afternoon (1p) appointments tend to have shortest wait times. [x]   Unfortunately, we cannot delay appointments for late arrivals or hold slots during phone calls.  Getting Answers and Following Up [x]   Simple Questions & Concerns: For quick questions or basic follow-up after your visit, reach us  at [phone] or MyChart messaging. [x]   Complex Concerns: If your concern is more complex, scheduling an appointment might be best. Discuss this with the staff to find the most suitable option. [x]   Lab &  Imaging Results: We'll contact you directly if results are abnormal or you don't use MyChart. Most normal results will be on MyChart within 2-3 business days, with a review message from Dr. Jesus. Haven't heard back in 2 weeks? Need results sooner? Contact us  at [phone]. [x]   Referrals: Our referral coordinator will manage specialist referrals. The specialist's office should contact you within 2 weeks to schedule an appointment. Call us  if you haven't heard from them after 2 weeks.  Staying Connected [x]   MyChart: Activate your MyChart for the fastest way to access results and message us . See the last page of this paperwork for instructions on how to activate.  Bring to Your Next Appointment [x]   Medications: Please bring all your medication bottles to your next appointment to ensure we have an accurate record of your prescriptions. [x]   Health Diaries: If you're monitoring any health conditions at home, keeping a diary of your readings can be very helpful for discussions at your next appointment.  Billing [x]   X-ray & Lab Orders: These are billed by separate companies. Contact the invoicing company directly for questions or concerns. [x]   Visit Charges: Discuss any billing inquiries with our administrative services team.  Your Satisfaction Matters [x]   Share Your Experience: We strive for your satisfaction! If you have any complaints, or preferably compliments, please let Dr. Jesus know directly or contact our Practice Administrators, Manuelita Rubin or [employer], by asking at the front desk.   Reviewing Your Records [x]   Review this early draft of your clinical encounter notes below and the final encounter summary tomorrow on MyChart after its been completed.  All orders placed so far  are visible here: Allergy, subsequent encounter  Anxiety disorder, unspecified type  OSA (obstructive sleep apnea) -     Tirzepatide -Weight Management; Inject 2.5 mg into the skin once a week.   Dispense: 2 mL; Refill: 11  Primary hypertension  Central adiposity -     Tirzepatide -Weight Management; Inject 2.5 mg into the skin once a week.  Dispense: 2 mL; Refill: 11  Hypertriglyceridemia Assessment & Plan: There is no firm evidence of benefit from attempts to target low high-density lipoprotein (HDL) cholesterol, so I do not do so.   The medicines we have (niacin, fibrates, and estrogen)  just aren't very good at raising HDL and cause more problems than benefits.   Therefore, most experts and I believe the best approach is to just be more aggressive on lowering LDL, since we do have really good medicines for that.   Of course, I also recommend regular exercise, smoking cessation, attainment of target body weight, and a healthy diet as proven measures to reduce the risk of bad cholesterol (to prevent heart attacks and strokes). These behaviors have been associated with increases in HDL cholesterol... but it is a weak association.  Low HDL is mostly a genetic problem that will require future advances in medicine to truly increase it enough to effectively reduce risk of heart attacks and strokes.   Some medications such as beta blockers, benzodiazepines, and androgens are associated with a decrease in HDL cholesterol, but I do not stop these drugs if they are important to the treatment of other medical conditions.   However I do encourage reconsidering their use and if they might be able to be stopped and let me know if you would like to try stopping.     Metabolic syndrome X  Primary insomnia  Melanocytic nevus, unspecified location  Irritable bowel syndrome with diarrhea  Restless legs -     rOPINIRole  HCl; Take 1 tablet (0.25 mg total) by mouth 3 (three) times daily.  Dispense: 90 tablet; Refill: 4  Mixed hyperlipidemia  Morbid obesity (HCC) -     Tirzepatide -Weight Management; Inject 2.5 mg into the skin once a week.  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

## 2023-11-25 NOTE — Progress Notes (Signed)
 [facility]  Phone: [phone]  - Medical Office Visit -  Visit Date: 11/25/2023 Patient: Jason Benitez   DOB: 1971/05/28   53 y.o. Male  MRN: [id number] Patient Care Team: Jesus Bernardino MATSU, MD as PCP - General (Internal Medicine) Lonni Slain, MD as PCP - Cardiology (Cardiology) Today's Health Care Provider: Bernardino MATSU Jesus, MD  ===========================================  Chief Complaint / Reason for Visit: New Patient (Initial Visit)   Subjective  53 y.o. male who has DYSLIPIDEMIA; EXOGENOUS OBESITY; Allergic rhinitis; Asthma; LOW BACK PAIN; NEPHROLITHIASIS, HX OF; Allergies; Anxiety disorder; OSA (obstructive sleep apnea); Hypertension; Central adiposity; Hypertriglyceridemia; Metabolic syndrome X; Melanocytic nevus; IBS (irritable bowel syndrome); Restless legs; Mixed hyperlipidemia; Morbid obesity (HCC); Gout; GERD (gastroesophageal reflux disease); NAFLD (nonalcoholic fatty liver disease); Liver function test abnormality; History of retinal detachment; Coronary artery calcification; Dilation of thoracic aorta (HCC); DDD (degenerative disc disease), cervical; Elevated serum creatinine; Prediabetes; Aneurysmal dilatation (HCC); Abnormal EKG; History of acute pyelonephritis; Impingement syndrome of right shoulder; Kidney stone; and History of unstable angina on their problem list.  HPI History of Present Illness He has a history of hypertension, managed with amlodipine  10 mg and valsartan  320 mg. His blood pressure is stable, typically 125-130/80 mmHg, after previous episodes of elevated readings around 150/100 mmHg. He has experienced spikes leading to hospital visits, managed with medication adjustments. No current headaches or other symptoms related to hypertension.  He has a history of coronary artery disease with a calcium  score between 100-200 and is scheduled for annual imaging. Past EKGs have been normal, except for one indicating inferior  ischemia. No history of heart attack or stroke. No chest pain, shortness of breath, or palpitations.  He has dyslipidemia with high triglycerides and low HDL. Triglycerides have been as high as 500 mg/dL but are currently around 220 mg/dL. He attributes recent elevations to dietary indulgences during the holiday season. There is a family history of similar lipid issues.  He has a history of anxiety, initially presenting with panic attacks and sensations of spinning while traveling. Currently managed with medication, which he finds effective, though he notes increased irritability when not medicated.  He has obstructive sleep apnea and uses a CPAP machine, which is auto-adjusting and reportedly effective. He is seeking weight loss medication to improve his condition.  He has IBS, primarily experiencing diarrhea. He has not undergone specific dietary testing or treatment for this condition. Reports diarrhea more than constipation.  He experiences symptoms of restless legs syndrome, particularly at night, causing significant discomfort. This condition has not been previously treated.  He reports neck and back pain, attributed to a T6 vertebra issue. He has undergone physical therapy, which provided some relief, but continues to experience pain, especially when sitting or driving.  He has a history of gout, primarily affecting his right big toe, but has not had an episode in four years since discontinuing medication. No current gout symptoms.  He has a history of retinal detachment, which was surgically treated. He has not had recent follow-up with an eye doctor.    Problem list overviews that were updated at today's visit: Problem  Hypertriglyceridemia   Medications: not yet discussed Lab Results  Component Value Date   HDL 26.70 (L) 03/08/2013   HDL 34.50 (L) 03/14/2011   HDL 28.30 (L) 09/20/2009   CHOLHDL 8 03/08/2013   CHOLHDL 6 03/14/2011   CHOLHDL 7 09/20/2009   Lab Results   Component Value Date   LDLDIRECT 125.2 03/08/2013  LDLDIRECT 114.7 03/14/2011   LDLDIRECT 99.2 09/20/2009   Lab Results  Component Value Date   TRIG 191.0 (H) 03/08/2013   TRIG (H) 03/14/2011    422.0 Triglyceride is over 400; calculations on Lipids are invalid.   TRIG 290.0 (H) 09/20/2009   Lab Results  Component Value Date   CHOL 220 (H) 03/08/2013   CHOL 213 (H) 03/14/2011   CHOL 212 (H) 09/20/2009   The ASCVD Risk score (Arnett DK, et al., 2019) failed to calculate for the following reasons:   Cannot find a previous HDL lab   Cannot find a previous total cholesterol lab Lab Results  Component Value Date   ALT 38 05/20/2019   AST 36 05/20/2019   ALKPHOS 40 05/20/2019   TSH 0.62 03/08/2013   Body mass index is 39.92 kg/m.  Lipoprotein(a), Apolipoprotein B (ApoB), and High-sensitivity C-reactive protein (hs-CRP) No results found for: HSCRP, LIPOA      Medications reviewed and modified: Medications verbalized - Amlodipine  - Losartan - Escitalopram  Current Outpatient Medications on File Prior to Visit  Medication Sig   albuterol  (VENTOLIN  HFA) 108 (90 Base) MCG/ACT inhaler Inhale 2 puffs into the lungs every 4 (four) hours as needed.   amLODipine  (NORVASC ) 10 MG tablet Take 10 mg by mouth. At bedtime.   buPROPion  (WELLBUTRIN  XL) 150 MG 24 hr tablet Take 150 mg by mouth every morning.   escitalopram  (LEXAPRO ) 20 MG tablet Take 20 mg by mouth daily. At bedtime.   fenofibrate  160 MG tablet Take 160 mg by mouth daily.   loratadine  (CLARITIN ) 10 MG tablet Take 10 mg by mouth daily.   montelukast  (SINGULAIR ) 10 MG tablet Take 10 mg by mouth daily.   rosuvastatin  (CRESTOR ) 20 MG tablet Take 20 mg by mouth at bedtime. At bedtime.   valsartan -hydrochlorothiazide  (DIOVAN -HCT) 320-25 MG tablet Take 1 tablet by mouth daily.   No current facility-administered medications on file prior to visit.   Medications Discontinued During This Encounter  Medication Reason    fluticasone  (FLONASE ) 50 MCG/ACT nasal spray    valsartan -hydrochlorothiazide  (DIOVAN -HCT) 320-12.5 MG tablet    aspirin  EC 81 MG tablet     Problems: has DYSLIPIDEMIA; EXOGENOUS OBESITY; Allergic rhinitis; Asthma; LOW BACK PAIN; NEPHROLITHIASIS, HX OF; Allergies; Anxiety disorder; OSA (obstructive sleep apnea); Hypertension; Central adiposity; Hypertriglyceridemia; Metabolic syndrome X; Melanocytic nevus; IBS (irritable bowel syndrome); Restless legs; Mixed hyperlipidemia; Morbid obesity (HCC); Gout; GERD (gastroesophageal reflux disease); NAFLD (nonalcoholic fatty liver disease); Liver function test abnormality; History of retinal detachment; Coronary artery calcification; Dilation of thoracic aorta (HCC); DDD (degenerative disc disease), cervical; Elevated serum creatinine; Prediabetes; Aneurysmal dilatation (HCC); Abnormal EKG; History of acute pyelonephritis; Impingement syndrome of right shoulder; Kidney stone; and History of unstable angina on their problem list. Current Meds  Medication Sig   albuterol  (VENTOLIN  HFA) 108 (90 Base) MCG/ACT inhaler Inhale 2 puffs into the lungs every 4 (four) hours as needed.   amLODipine  (NORVASC ) 10 MG tablet Take 10 mg by mouth. At bedtime.   buPROPion  (WELLBUTRIN  XL) 150 MG 24 hr tablet Take 150 mg by mouth every morning.   escitalopram  (LEXAPRO ) 20 MG tablet Take 20 mg by mouth daily. At bedtime.   fenofibrate  160 MG tablet Take 160 mg by mouth daily.   loratadine  (CLARITIN ) 10 MG tablet Take 10 mg by mouth daily.   montelukast  (SINGULAIR ) 10 MG tablet Take 10 mg by mouth daily.   rOPINIRole  (REQUIP ) 0.25 MG tablet Take 1 tablet (0.25 mg total) by mouth 3 (three) times  daily.   rosuvastatin  (CRESTOR ) 20 MG tablet Take 20 mg by mouth at bedtime. At bedtime.   tirzepatide  (ZEPBOUND ) 2.5 MG/0.5ML Pen Inject 2.5 mg into the skin once a week.   valsartan -hydrochlorothiazide  (DIOVAN -HCT) 320-25 MG tablet Take 1 tablet by mouth daily.   Allergies:  No Known  Allergies Past Medical History:  has a past medical history of ALLERGIC RHINITIS (05/03/2007), Allergy, Anemia, Anxiety, ASTHMA (05/03/2007), Asthma, Chronic kidney disease, Depression, DYSLIPIDEMIA (05/03/2007), EXOGENOUS OBESITY (05/03/2007), History of kidney stones, History of sleep apnea, Hypertension, Insomnia (11/25/2023), LOW BACK PAIN (03/30/2008), NEPHROLITHIASIS, HX OF (03/30/2008), and Neuromuscular disorder (HCC). Past Medical History - Panic attacks - High blood pressure - High triglycerides - Low HDL - Borderline prediabetes - Insomnia - Melanocytic nevus - IBS - Restless legs - Gout - Headaches - Asthma - Allergies - Indigestion - Retinal detachment - Inferior ischemia - Unstable angina - Abnormal EKG - Coronary artery calcification - Sleep apnea - Kidney stones Past Surgical History:   has a past surgical history that includes No past surgeries and Extracorporeal shock wave lithotripsy (Left, 05/14/2017). Social History:   reports that he quit smoking about 33 years ago. His smoking use included cigarettes. He has never used smokeless tobacco. He reports current alcohol use of about 5.0 standard drinks of alcohol per week. He reports that he does not currently use drugs after having used the following drugs: Marijuana. Family History:  family history includes Cancer in his father and maternal grandfather; Diabetes in his father and mother; Early death in his father; Heart attack in his maternal grandfather; Hyperlipidemia in his mother; Hypertension in his mother. Depression Screen and Health Maintenance:    11/25/2023   10:06 AM  PHQ 2/9 Scores  PHQ - 2 Score 0   Health Maintenance  Topic Date Due   DTaP/Tdap/Td (1 - Tdap) Never done   Pneumococcal Vaccine 31-69 Years old (2 of 2 - PPSV23 or PCV20) 10/22/2021   INFLUENZA VACCINE  05/21/2023   COVID-19 Vaccine (6 - 2024-25 season) 12/18/2023 (Originally 06/21/2023)   Hepatitis C Screening  11/24/2024 (Originally  01/31/1989)   HIV Screening  11/24/2024 (Originally 01/31/1986)   Colonoscopy  12/29/2029   Zoster Vaccines- Shingrix  Completed   HPV VACCINES  Aged Out   Immunization History  Administered Date(s) Administered   Influenza Inj Mdck Quad Pf 08/03/2017, 06/07/2018, 10/28/2022   Influenza,inj,Quad PF,6+ Mos 07/13/2014   PFIZER Comirnaty(Gray Top)Covid-19 Tri-Sucrose Vaccine 08/02/2021   PFIZER(Purple Top)SARS-COV-2 Vaccination 01/01/2020, 01/23/2020, 10/03/2020   Pfizer(Comirnaty)Fall Seasonal Vaccine 12 years and older 10/28/2022   Pneumococcal Conjugate-13 08/27/2021   Zoster Recombinant(Shingrix) 08/27/2021, 10/28/2022     Objective   Physical ExamBP 132/82   Pulse 75   Temp 97.9 F (36.6 C) (Temporal)   Ht 5' 10 (1.778 m)   Wt 278 lb 3.2 oz (126.2 kg)   SpO2 96%   BMI 39.92 kg/m  Wt Readings from Last 10 Encounters:  11/25/23 278 lb 3.2 oz (126.2 kg)  05/09/22 275 lb (124.7 kg)  11/11/21 277 lb (125.6 kg)  06/27/21 277 lb (125.6 kg)  12/30/19 272 lb (123.4 kg)  12/16/19 272 lb (123.4 kg)  05/14/17 256 lb 6.4 oz (116.3 kg)  01/21/16 250 lb (113.4 kg)  07/19/14 251 lb (113.9 kg)  03/09/14 248 lb (112.5 kg)  Vital signs reviewed.  Nursing notes reviewed. Weight trend reviewed. Abnormalities and problem-specific physical exam findings:  truncal adiposity  General Appearance:  Well developed, well nourished, well-groomed, healthy-appearing male with Body  mass index is 39.92 kg/m. No acute distress appreciable.   Skin: Clear and well-hydrated. Pulmonary:  Normal work of breathing at rest, no respiratory distress apparent. SpO2: 96 %  Musculoskeletal: He demonstrates smooth and coordinated movements throughout all major joints.All extremities are intact.  Neurological:  Awake, alert, oriented, and engaged.  No obvious focal neurological deficits or cognitive impairments.  Sensorium seems unclouded.  Psychiatric:  Appropriate mood, pleasant and cooperative demeanor, cheerful  and engaged during the exam  Reviewed Results & Data Results LABS Triglycerides: 220 mg/dL PSA: 0.3 ng/mL (98/71/7974)  RADIOLOGY Coronary artery calcification: 100-200 score  DIAGNOSTIC EKG: Evidence of inferior ischemia    No results found for any visits on 11/25/23.  No visits with results within 1 Year(s) from this visit.  Latest known visit with results is:  Admission on 05/09/2022, Discharged on 05/09/2022  Component Date Value   Troponin I (High Sensiti* 05/09/2022 3    Sodium 05/09/2022 137    Potassium 05/09/2022 3.7    Chloride 05/09/2022 99    CO2 05/09/2022 27    Glucose, Bld 05/09/2022 112 (H)    BUN 05/09/2022 23 (H)    Creatinine, Ser 05/09/2022 1.32 (H)    Calcium  05/09/2022 10.0    GFR, Estimated 05/09/2022 >60    Anion gap 05/09/2022 11    WBC 05/09/2022 6.6    RBC 05/09/2022 5.25    Hemoglobin 05/09/2022 15.5    HCT 05/09/2022 45.1    MCV 05/09/2022 85.9    MCH 05/09/2022 29.5    MCHC 05/09/2022 34.4    RDW 05/09/2022 13.8    Platelets 05/09/2022 239    nRBC 05/09/2022 0.0    Troponin I (High Sensiti* 05/09/2022 3    No image results found.   No results found.  No results found.          Assessment & Plan OSA (obstructive sleep apnea) Obstructive Sleep Apnea Diagnosed with sleep apnea and currently using a ResMed CPAP machine. Discussed tirzepatide  (Zepbound ) for weight loss, which may improve sleep apnea and cardiovascular health. Explained the insurance approval process and challenges. Prescribe tirzepatide  2.5 mg. Follow up in one month to assess efficacy and side effects. Continue CPAP usage. Anxiety disorder, unspecified type Anxiety Managed with medication, with increased irritability without it. Emphasized medication adherence and regular follow-up. Continue current anxiety medication and monitor symptom changes. Primary hypertension Hypertension Blood pressure previously reached 160/100 mmHg, now managed with amlodipine  10 mg  and valsartan  320/25 mg. Home readings are around 125-130/80 mmHg. Emphasized regular monitoring and medication adherence to prevent complications. Request previous medical records. Continue current antihypertensive regimen and monitor blood pressure at home. Central adiposity Diagnosed with sleep apnea and currently using a ResMed CPAP machine. Discussed tirzepatide  (Zepbound ) for weight loss, which may improve sleep apnea and cardiovascular health. Explained the insurance approval process and challenges. Prescribe tirzepatide  2.5 mg. Follow up in one month to assess efficacy and side effects. Continue CPAP usage. Hypertriglyceridemia There is no firm evidence of benefit from attempts to target low high-density lipoprotein (HDL) cholesterol, so I do not do so.   The medicines we have (niacin, fibrates, and estrogen)  just aren't very good at raising HDL and cause more problems than benefits.   Therefore, most experts and I believe the best approach is to just be more aggressive on lowering LDL, since we do have really good medicines for that.   Of course, I also recommend regular exercise, smoking cessation, attainment of target body weight, and  a healthy diet as proven measures to reduce the risk of bad cholesterol (to prevent heart attacks and strokes). These behaviors have been associated with increases in HDL cholesterol... but it is a weak association.  Low HDL is mostly a genetic problem that will require future advances in medicine to truly increase it enough to effectively reduce risk of heart attacks and strokes.   Some medications such as beta blockers, benzodiazepines, and androgens are associated with a decrease in HDL cholesterol, but I do not stop these drugs if they are important to the treatment of other medical conditions.   However I do encourage reconsidering their use and if they might be able to be stopped and let me know if you would like to try stopping.  Metabolic syndrome X Diagnosed  with sleep apnea and currently using a ResMed CPAP machine. Discussed tirzepatide  (Zepbound ) for weight loss, which may improve sleep apnea and cardiovascular health. Explained the insurance approval process and challenges. Prescribe tirzepatide  2.5 mg. Follow up in one month to assess efficacy and side effects. Continue CPAP usage. Irritable bowel syndrome with diarrhea Irritable Bowel Syndrome (IBS) IBS with predominant diarrhea. Discussed dietary modifications to identify triggers, trialing lactose-free and gluten-free diets. Provided educational materials. Provide IBS dietary handout. Trial a two-week lactose-free diet, and if no improvement, trial a two-week gluten-free diet. Consider medication if dietary changes fail. Restless legs Restless Legs Syndrome Experiencing leg discomfort and jerking at night, with no previous treatment. Discussed Requip  benefits and side effects, and potential iron or B12 deficiency. Prescribe low-dose Requip  and monitor symptom improvement and side effects. Mixed hyperlipidemia Mixed Hyperlipidemia High triglycerides have decreased from 500 mg/dL to 779 mg/dL, with low HDL levels. Discussed lifestyle modifications to increase HDL, primarily concerned due to genetic factors. Continue lipid-lowering therapy and discuss lifestyle changes. Review recent lab results. Morbid obesity (HCC) . Discussed tirzepatide  (Zepbound ) for weight loss, which may improve sleep apnea and cardiovascular health. Explained the insurance approval process and challenges. Prescribe tirzepatide  2.5 mg. Follow up in one month to assess efficacy and side effects.  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 Discussed tirzepatide  (Zepbound ) for weight loss, which may improve sleep apnea and cardiovascular health. Explained the insurance approval process and challenges. Prescribe tirzepatide  2.5 mg. Follow up in one month to assess efficacy and side effects.   NAFLD (nonalcoholic fatty liver disease) . Discussed tirzepatide  (Zepbound ) for weight loss, which may improve sleep apnea and cardiovascular health as well and also studies show it improves Nonalcoholic Fatty Liver Disease (NAFLD) . Explained the insurance approval process and challenges. Prescribe tirzepatide  2.5 mg. Follow up in one month to assess efficacy and side effects.  Coronary artery calcification Coronary Artery Disease Coronary artery calcification score is 100-200, with a previous EKG showing inferior ischemia. Discussed the importance of annual imaging to monitor disease progression and prevent complications. Order annual thoracic aorta imaging and review upcoming imaging results from [facility]. Monitor for symptoms of unstable angina.  General Health Maintenance Discussed flu vaccination and general health screenings. Advised against flu vaccination due to seasonality and upcoming travel. No flu vaccination at this time. Request transfer of medical records from previous providers and review recent lab results.  Recommended follow up: Follow up in one month to assess tirzepatide  efficacy and side effects. Sign release for lab results and request [facility] to forward lab results. Referral to a new eye doctor for retinal detachment follow-up.  Ordered    Ambulatory referral to Ophthalmology     follow up retinal detachment    11/25/23 1042    tirzepatide  (ZEPBOUND ) 2.5 MG/0.5ML Pen  Weekly       Note to Pharmacy: Per Dr. Jesus via SecureChat, change to P[facility] from [facility]   11/25/23 1056    rOPINIRole  (REQUIP ) 0.25 MG tablet  3 times daily        11/25/23 1101                       Additional notes: This document was synthesized by artificial intelligence (Abridge) using HIPAA-compliant recording of the clinical interaction;   We discussed the use of AI scribe software for clinical note transcription with the patient, who gave verbal consent to  proceed.    Additional Info: This encounter employed state-of-the-art, real-time, collaborative documentation. The patient actively reviewed and assisted in updating their electronic medical record on a shared screen, ensuring transparency and facilitating joint problem-solving for the problem list, overview, and plan. This approach promotes accurate, informed care. The treatment plan was discussed and reviewed in detail, including medication safety, potential side effects, and all patient questions. We confirmed understanding and comfort with the plan. Follow-up instructions were established, including contacting the office for any concerns, returning if symptoms worsen, persist, or new symptoms develop, and precautions for potential emergency department visits.  Initial Appointment Goals:  This initial visit focused on establishing a foundation for the patient's care. We collaboratively reviewed his medical history and medications in detail, updating the chart as shown in the encounter. Given the extensive information, we prioritized addressing his most pressing concerns, which he reported were: New Patient (Initial Visit)  While the complexity of the patient's medical picture may necessitate further evaluation in subsequent visits, we were able to develop a preliminary care plan together. To expedite a comprehensive plan at the next visit, we encouraged the patient to gather relevant medical records from previous providers. This collaborative approach will ensure a more complete understanding of the patient's health and inform the development of a personalized care plan. We look forward to continuing the conversation and working together with the patient on achieving his health goals.   Collaborative Documentation:  Today's encounter utilized real-time, dynamic patient engagement.  Patients actively participate by directly reviewing and assisting in updating their medical records through a shared screen.  This transparency empowers patients to visually confirm chart updates made by the healthcare provider.  This collaborative approach facilitates problem management as we jointly update the problem list, problem overview, and assessment/plan. Ultimately, this process enhances chart accuracy and completeness, fostering shared decision-making, patient education, and informed consent for tests and treatments.  Collaborative Treatment Planning:  Treatment plans were discussed and reviewed in detail.  Explained medication safety and potential side effects.  Encouraged participation and answered all patient questions, confirming understanding and comfort with the plan. Encouraged patient to contact our office if they have any questions or concerns. Agreed on patient returning to office if symptoms worsen, persist, or new symptoms develop.  ----------------------------------------------------- Bernardino KANDICE Jesus, MD  11/26/2023 3:12 PM  [facility]:  [phone]

## 2023-11-25 NOTE — Assessment & Plan Note (Addendum)
There is no firm evidence of benefit from attempts to target low high-density lipoprotein (HDL) cholesterol, so I do not do so.   The medicines we have (niacin, fibrates, and estrogen)  just aren't very good at raising HDL and cause more problems than benefits.   Therefore, most experts and I believe the best approach is to just be more aggressive on lowering LDL, since we do have really good medicines for that.   Of course, I also recommend regular exercise, smoking cessation, attainment of target body weight, and a healthy diet as proven measures to reduce the risk of bad cholesterol (to prevent heart attacks and strokes). These behaviors have been associated with increases in HDL cholesterol... but it is a weak association.  Low HDL is mostly a genetic problem that will require future advances in medicine to truly increase it enough to effectively reduce risk of heart attacks and strokes.   Some medications such as beta blockers, benzodiazepines, and androgens are associated with a decrease in HDL cholesterol, but I do not stop these drugs if they are important to the treatment of other medical conditions.   However I do encourage reconsidering their use and if they might be able to be stopped and let me know if you would like to try stopping.

## 2023-11-26 NOTE — Assessment & Plan Note (Signed)
 Coronary Artery Disease Coronary artery calcification score is 100-200, with a previous EKG showing inferior ischemia. Discussed the importance of annual imaging to monitor disease progression and prevent complications. Order annual thoracic aorta imaging and review upcoming imaging results from [facility]. Monitor for symptoms of unstable angina.

## 2023-11-26 NOTE — Assessment & Plan Note (Signed)
.   Discussed tirzepatide  (Zepbound ) for weight loss, which may improve sleep apnea and cardiovascular health. Explained the insurance approval process and challenges. Prescribe tirzepatide  2.5 mg. Follow up in one month to assess efficacy and side effects.

## 2023-11-26 NOTE — Assessment & Plan Note (Signed)
 Mixed Hyperlipidemia High triglycerides have decreased from 500 mg/dL to 779 mg/dL, with low HDL levels. Discussed lifestyle modifications to increase HDL, primarily concerned due to genetic factors. Continue lipid-lowering therapy and discuss lifestyle changes. Review recent lab results.

## 2023-11-26 NOTE — Assessment & Plan Note (Signed)
 Diagnosed with sleep apnea and currently using a ResMed CPAP machine. Discussed tirzepatide  (Zepbound ) for weight loss, which may improve sleep apnea and cardiovascular health. Explained the insurance approval process and challenges. Prescribe tirzepatide  2.5 mg. Follow up in one month to assess efficacy and side effects. Continue CPAP usage.

## 2023-11-26 NOTE — Assessment & Plan Note (Signed)
 Irritable Bowel Syndrome (IBS) IBS with predominant diarrhea. Discussed dietary modifications to identify triggers, trialing lactose-free and gluten-free diets. Provided educational materials. Provide IBS dietary handout. Trial a two-week lactose-free diet, and if no improvement, trial a two-week gluten-free diet. Consider medication if dietary changes fail.

## 2023-11-26 NOTE — Assessment & Plan Note (Signed)
 Restless Legs Syndrome Experiencing leg discomfort and jerking at night, with no previous treatment. Discussed Requip  benefits and side effects, and potential iron or B12 deficiency. Prescribe low-dose Requip  and monitor symptom improvement and side effects.

## 2023-11-26 NOTE — Assessment & Plan Note (Signed)
.   Discussed tirzepatide  (Zepbound ) for weight loss, which may improve sleep apnea and cardiovascular health as well and also studies show it improves Nonalcoholic Fatty Liver Disease (NAFLD) . Explained the insurance approval process and challenges. Prescribe tirzepatide  2.5 mg. Follow up in one month to assess efficacy and side effects.

## 2023-11-26 NOTE — Assessment & Plan Note (Signed)
 Hypertension Blood pressure previously reached 160/100 mmHg, now managed with amlodipine  10 mg and valsartan  320/25 mg. Home readings are around 125-130/80 mmHg. Emphasized regular monitoring and medication adherence to prevent complications. Request previous medical records. Continue current antihypertensive regimen and monitor blood pressure at home.

## 2023-11-26 NOTE — Assessment & Plan Note (Signed)
 Obstructive Sleep Apnea Diagnosed with sleep apnea and currently using a ResMed CPAP machine. Discussed tirzepatide  (Zepbound ) for weight loss, which may improve sleep apnea and cardiovascular health. Explained the insurance approval process and challenges. Prescribe tirzepatide  2.5 mg. Follow up in one month to assess efficacy and side effects. Continue CPAP usage.

## 2023-11-26 NOTE — Assessment & Plan Note (Signed)
 Anxiety Managed with medication, with increased irritability without it. Emphasized medication adherence and regular follow-up. Continue current anxiety medication and monitor symptom changes.

## 2023-11-27 ENCOUNTER — Other Ambulatory Visit (HOSPITAL_COMMUNITY): Payer: Self-pay

## 2023-11-27 DIAGNOSIS — I77810 Thoracic aortic ectasia: Secondary | ICD-10-CM | POA: Diagnosis not present

## 2023-11-28 ENCOUNTER — Other Ambulatory Visit (HOSPITAL_BASED_OUTPATIENT_CLINIC_OR_DEPARTMENT_OTHER): Payer: Self-pay

## 2023-11-29 DIAGNOSIS — G4733 Obstructive sleep apnea (adult) (pediatric): Secondary | ICD-10-CM | POA: Diagnosis not present

## 2023-12-01 ENCOUNTER — Other Ambulatory Visit (HOSPITAL_COMMUNITY): Payer: Self-pay

## 2023-12-03 ENCOUNTER — Other Ambulatory Visit (HOSPITAL_BASED_OUTPATIENT_CLINIC_OR_DEPARTMENT_OTHER): Payer: Self-pay

## 2023-12-09 ENCOUNTER — Encounter: Payer: Self-pay | Admitting: Internal Medicine

## 2023-12-09 DIAGNOSIS — R7989 Other specified abnormal findings of blood chemistry: Secondary | ICD-10-CM

## 2023-12-09 DIAGNOSIS — E669 Obesity, unspecified: Secondary | ICD-10-CM

## 2023-12-09 DIAGNOSIS — I10 Essential (primary) hypertension: Secondary | ICD-10-CM

## 2023-12-09 DIAGNOSIS — G4733 Obstructive sleep apnea (adult) (pediatric): Secondary | ICD-10-CM | POA: Diagnosis not present

## 2023-12-09 DIAGNOSIS — E781 Pure hyperglyceridemia: Secondary | ICD-10-CM

## 2023-12-09 DIAGNOSIS — Z87442 Personal history of urinary calculi: Secondary | ICD-10-CM

## 2023-12-09 DIAGNOSIS — J309 Allergic rhinitis, unspecified: Secondary | ICD-10-CM

## 2023-12-09 DIAGNOSIS — J45909 Unspecified asthma, uncomplicated: Secondary | ICD-10-CM

## 2023-12-10 NOTE — Telephone Encounter (Signed)
MyChart secure digital messaging clinical encounter  Chief Complaint: Patient inquiry regarding recent CT scan and blood work results  Relevant History: - 53 year old male with complex medical history including:   - Thoracic aortic dilation (4 cm on 10/2021 cardiac CT)   - Coronary artery calcification (Agatston Score 130 on 10/25/21)   - Mixed hyperlipidemia   - Hypertension   - OSA   - Morbid obesity   - NAFLD - Last documented imaging: Cardiac CT from 10/25/2021 - Annual thoracic aorta imaging recommended per guidelines - Recently started on tirzepatide for weight management - Next scheduled follow-up: 12/25/2023  Assessment: 1. Status post surveillance imaging and lab work, results pending (Z01.89) 2. Thoracic aortic dilation, requiring surveillance (I71.2) 3. Mixed hyperlipidemia (E78.2)  Plan: 1. Records request initiated for recent CT scan and laboratory results 2. Will review results upon receipt and communicate findings to patient 3. Maintain scheduled follow-up appointment on 12/25/2023 4. Emergency precautions provided regarding aortic symptoms 5. Guidelines followed per 2010 ACCF/AHA/AATS/ACR/ASA/SCA/SCAI/SIR/STS/SVM Guidelines for the Diagnosis and Management of Patients with Thoracic Aortic Disease  Medical Decision Making: Moderate complexity due to: - Multiple chronic conditions requiring monitoring - Coordination of care with outside facilities - Management of thoracic aortic dilation requiring careful surveillance - Need for systematic review of pending diagnostic studies  Please see the MyChart message reply(ies) for my assessment and plan.  This patient gave consent for this Medical Advice Message and is aware that it may result in a bill to [employer], as well as the possibility of receiving a bill for a co-payment or deductible. They are an established patient, but are not seeking medical advice exclusively about a problem treated during an in person  or video visit in the last seven days. I did not recommend an in person or video visit within seven days of my reply.  I spent a total of 12 minutes cumulative time within 7 days through [employer].  Lula Olszewski, MD

## 2023-12-11 ENCOUNTER — Encounter: Payer: Self-pay | Admitting: Family Medicine

## 2023-12-13 ENCOUNTER — Encounter: Payer: Self-pay | Admitting: Internal Medicine

## 2023-12-13 DIAGNOSIS — R9389 Abnormal findings on diagnostic imaging of other specified body structures: Secondary | ICD-10-CM | POA: Insufficient documentation

## 2023-12-25 ENCOUNTER — Other Ambulatory Visit (HOSPITAL_BASED_OUTPATIENT_CLINIC_OR_DEPARTMENT_OTHER): Payer: Self-pay

## 2023-12-25 ENCOUNTER — Encounter: Payer: Self-pay | Admitting: Internal Medicine

## 2023-12-25 ENCOUNTER — Ambulatory Visit: Payer: BC Managed Care – PPO | Admitting: Internal Medicine

## 2023-12-25 ENCOUNTER — Telehealth: Payer: Self-pay

## 2023-12-25 VITALS — BP 138/89 | HR 71 | Temp 97.5°F | Ht 70.0 in | Wt 275.0 lb

## 2023-12-25 DIAGNOSIS — E65 Localized adiposity: Secondary | ICD-10-CM

## 2023-12-25 DIAGNOSIS — R7989 Other specified abnormal findings of blood chemistry: Secondary | ICD-10-CM

## 2023-12-25 DIAGNOSIS — I10 Essential (primary) hypertension: Secondary | ICD-10-CM

## 2023-12-25 DIAGNOSIS — F419 Anxiety disorder, unspecified: Secondary | ICD-10-CM

## 2023-12-25 DIAGNOSIS — K760 Fatty (change of) liver, not elsewhere classified: Secondary | ICD-10-CM

## 2023-12-25 DIAGNOSIS — J45909 Unspecified asthma, uncomplicated: Secondary | ICD-10-CM

## 2023-12-25 DIAGNOSIS — J309 Allergic rhinitis, unspecified: Secondary | ICD-10-CM | POA: Diagnosis not present

## 2023-12-25 DIAGNOSIS — G2581 Restless legs syndrome: Secondary | ICD-10-CM

## 2023-12-25 DIAGNOSIS — E782 Mixed hyperlipidemia: Secondary | ICD-10-CM

## 2023-12-25 DIAGNOSIS — G4733 Obstructive sleep apnea (adult) (pediatric): Secondary | ICD-10-CM | POA: Diagnosis not present

## 2023-12-25 MED ORDER — ALBUTEROL SULFATE HFA 108 (90 BASE) MCG/ACT IN AERS: 2.0000 | INHALATION_SPRAY | RESPIRATORY_TRACT | 3 refills | Status: AC | PRN

## 2023-12-25 MED ORDER — FENOFIBRATE 160 MG PO TABS: 160.0000 mg | ORAL_TABLET | Freq: Every day | ORAL | 3 refills | Status: AC

## 2023-12-25 MED ORDER — ROPINIROLE HCL 0.25 MG PO TABS: 0.2500 mg | ORAL_TABLET | Freq: Three times a day (TID) | ORAL | 4 refills | Status: DC

## 2023-12-25 MED ORDER — VALSARTAN-HYDROCHLOROTHIAZIDE 320-25 MG PO TABS: 1.0000 | ORAL_TABLET | Freq: Every day | ORAL | 3 refills | Status: AC

## 2023-12-25 MED ORDER — ROSUVASTATIN CALCIUM 20 MG PO TABS: 20.0000 mg | ORAL_TABLET | Freq: Every day | ORAL | 3 refills | Status: AC

## 2023-12-25 MED ORDER — BUPROPION HCL ER (XL) 150 MG PO TB24: 150.0000 mg | ORAL_TABLET | Freq: Every morning | ORAL | 3 refills | Status: AC

## 2023-12-25 MED ORDER — ESCITALOPRAM OXALATE 20 MG PO TABS: 20.0000 mg | ORAL_TABLET | Freq: Every day | ORAL | 3 refills | Status: AC

## 2023-12-25 MED ORDER — TIRZEPATIDE-WEIGHT MANAGEMENT 2.5 MG/0.5ML ~~LOC~~ SOAJ: 2.5000 mg | SUBCUTANEOUS | 11 refills | Status: DC

## 2023-12-25 MED ORDER — AMLODIPINE BESYLATE 10 MG PO TABS: 10.0000 mg | ORAL_TABLET | Freq: Every day | ORAL | 3 refills | Status: AC

## 2023-12-25 MED ORDER — LORATADINE 10 MG PO TABS: 10.0000 mg | ORAL_TABLET | Freq: Every day | ORAL | 3 refills | Status: AC

## 2023-12-25 MED ORDER — MONTELUKAST SODIUM 10 MG PO TABS: 10.0000 mg | ORAL_TABLET | Freq: Every day | ORAL | 3 refills | Status: AC

## 2023-12-25 NOTE — Telephone Encounter (Signed)
 Copied from CRM [id number]. Topic: General - Other >> Dec 25, 2023  2:55 PM Jon Gills C wrote: Reason for CRM: Patient called in stating that lab work has been sent over to the office wanted to make aware   Forwarding to Dr. Jon Billings.  Donzetta Starch, CMA

## 2023-12-25 NOTE — Progress Notes (Signed)
 ==============================   [facility]: [phone]   -- Medical Office Visit --  Patient: Jason Benitez      Age: 53 y.o.       Sex:  male  Date:   12/25/2023 Today's Healthcare Provider: Lula Olszewski, MD  ==============================   CHIEF COMPLAINT: Follow-up (Pt would like to discuss Zebound rx ), Allergies, Anemia, Anxiety, Hyperlipidemia, Hypertension, and Depression  SUBJECTIVE: Background This is a 53 y.o. male who has DYSLIPIDEMIA; EXOGENOUS OBESITY; Allergic rhinitis; Asthma; LOW BACK PAIN; NEPHROLITHIASIS, HX OF; Allergies; Anxiety disorder; OSA (obstructive sleep apnea); Hypertension; Central adiposity; Hypertriglyceridemia; Metabolic syndrome X; Melanocytic nevus; IBS (irritable bowel syndrome); Restless legs; Mixed hyperlipidemia; Morbid obesity (HCC); Gout; GERD (gastroesophageal reflux disease); NAFLD (nonalcoholic fatty liver disease); Liver function test abnormality; History of retinal detachment; Coronary artery calcification; Dilation of thoracic aorta (HCC); DDD (degenerative disc disease), cervical; Elevated serum creatinine; Prediabetes; Aneurysmal dilatation (HCC); Abnormal EKG; History of acute pyelonephritis; Impingement syndrome of right shoulder; Kidney stone; History of unstable angina; and Abnormal CT of the chest on their problem list.  Discussed the use of AI scribe software for clinical note transcription with the patient, who gave verbal consent to proceed.  History of Present Illness The patient, with obesity and sleep apnea, presents with issues related to weight management and medication management.  He is experiencing challenges with weight management despite trying various diets, including low carb and low calorie, as well as eliminating wheat and inflammatory foods. His weight has not fluctuated more than five to ten pounds over the past ten years. He feels the weight impacts his joints and body  structure, making him afraid to exercise strenuously due to pressure on his joints, bones, and tendons.  He has sleep apnea and is using a CPAP machine. He is seeking medication assistance for weight management, specifically Zepbound. He has not received a rejection from his insurance but has not heard back regarding the preauthorization for Zepbound.  He is currently taking valsartan for hypertension but did not take his medication on the morning of the visit. He takes ropinirole for restless legs syndrome and uses albuterol for allergy-related asthma.  He has a history of elevated creatinine levels noted in previous lab work, but the current lab results have not been reviewed as he has not received them from his previous physician's office.  Visually reviewed that patient  has a past medical history of ALLERGIC RHINITIS (05/03/2007), Allergy, Anemia, Anxiety, ASTHMA (05/03/2007), Asthma, Chronic kidney disease, Depression, DYSLIPIDEMIA (05/03/2007), EXOGENOUS OBESITY (05/03/2007), History of kidney stones, History of sleep apnea, Hypertension, Insomnia (11/25/2023), LOW BACK PAIN (03/30/2008), NEPHROLITHIASIS, HX OF (03/30/2008), and Neuromuscular disorder (HCC). Manually updated: No problems updated. Verbally reviewed (per Abridge-generated extraction): Past Medical History - High cholesterol - Weight gain - Back pain - Allergies - Anxiety - Sleep apnea - Fatty liver disease - Restless legs syndrome Medications - Valsartan - Claritin - Albuterol - Zolpidem - Ropinirole Results LABS Serum creatinine: 2.5 (05/09/2022)  RADIOLOGY CT scan: No deterioration since the last CT scan  Visually reviewed and manually updated: No current outpatient medications on file prior to visit.   No current facility-administered medications on file prior to visit.   Medications Discontinued During This Encounter  Medication Reason   loratadine (CLARITIN) 10 MG tablet Reorder   albuterol  (VENTOLIN HFA) 108 (90 Base) MCG/ACT inhaler Reorder   buPROPion (WELLBUTRIN XL) 150 MG 24 hr tablet Reorder   fenofibrate 160 MG tablet Reorder  montelukast (SINGULAIR) 10 MG tablet Reorder   escitalopram (LEXAPRO) 20 MG tablet Reorder   amLODipine (NORVASC) 10 MG tablet Reorder   rosuvastatin (CRESTOR) 20 MG tablet Reorder   valsartan-hydrochlorothiazide (DIOVAN-HCT) 320-25 MG tablet Reorder   tirzepatide (ZEPBOUND) 2.5 MG/0.5ML Pen Reorder   rOPINIRole (REQUIP) 0.25 MG tablet Reorder      Objective      12/25/2023    1:32 PM 11/25/2023    9:58 AM 05/09/2022    5:30 PM  Vitals with BMI  Height 5\' 10"  5\' 10"    Weight 275 lbs 278 lbs 3 oz   BMI 39.46 39.92   Systolic 138 132 161  Diastolic 89 82 71  Pulse 71 75 100   Wt Readings from Last 10 Encounters:  12/25/23 275 lb (124.7 kg)  11/25/23 278 lb 3.2 oz (126.2 kg)  05/09/22 275 lb (124.7 kg)  11/11/21 277 lb (125.6 kg)  06/27/21 277 lb (125.6 kg)  12/30/19 272 lb (123.4 kg)  12/16/19 272 lb (123.4 kg)  05/14/17 256 lb 6.4 oz (116.3 kg)  01/21/16 250 lb (113.4 kg)  07/19/14 251 lb (113.9 kg)   Vital signs reviewed.  Nursing notes reviewed. Weight trend reviewed. Physical Exam General Appearance:  No acute distress appreciable.   Well-groomed, healthy-appearing male.  Well proportioned with no abnormal fat distribution.  Good muscle tone. Pulmonary:  Normal work of breathing at rest, no respiratory distress apparent. SpO2: 98 %  Musculoskeletal: All extremities are intact.  Neurological:  Awake, alert, oriented, and engaged.  No obvious focal neurological deficits or cognitive impairments.  Sensorium seems unclouded.   Speech is clear and coherent with logical content. Psychiatric:  Appropriate mood, pleasant and cooperative demeanor, thoughtful and engaged during the exam   Last CBC Lab Results  Component Value Date   WBC 6.6 05/09/2022   HGB 15.5 05/09/2022   HCT 45.1 05/09/2022   MCV 85.9 05/09/2022   MCH 29.5  05/09/2022   RDW 13.8 05/09/2022   PLT 239 05/09/2022   Last metabolic panel Lab Results  Component Value Date   GLUCOSE 112 (H) 05/09/2022   NA 137 05/09/2022   K 3.7 05/09/2022   CL 99 05/09/2022   CO2 27 05/09/2022   BUN 23 (H) 05/09/2022   CREATININE 1.32 (H) 05/09/2022   GFRNONAA >60 05/09/2022   CALCIUM 10.0 05/09/2022   PROT 7.0 05/20/2019   ALBUMIN 3.8 05/20/2019   BILITOT 0.4 05/20/2019   ALKPHOS 40 05/20/2019   AST 36 05/20/2019   ALT 38 05/20/2019   ANIONGAP 11 05/09/2022   Last lipids Lab Results  Component Value Date   CHOL 220 (H) 03/08/2013   HDL 26.70 (L) 03/08/2013   LDLDIRECT 125.2 03/08/2013   TRIG 191.0 (H) 03/08/2013   CHOLHDL 8 03/08/2013   Last hemoglobin A1c No results found for: "HGBA1C" Last thyroid functions Lab Results  Component Value Date   TSH 0.62 03/08/2013   Last vitamin D No results found for: "25OHVITD2", "25OHVITD3", "VD25OH"    No results found for any visits on 12/25/23. No visits with results within 1 Year(s) from this visit.  Latest known visit with results is:  Admission on 05/09/2022, Discharged on 05/09/2022  Component Date Value   Troponin I (High Sensiti* 05/09/2022 3    Sodium 05/09/2022 137    Potassium 05/09/2022 3.7    Chloride 05/09/2022 99    CO2 05/09/2022 27    Glucose, Bld 05/09/2022 112 (H)    BUN 05/09/2022 23 (H)  Creatinine, Ser 05/09/2022 1.32 (H)    Calcium 05/09/2022 10.0    GFR, Estimated 05/09/2022 >60    Anion gap 05/09/2022 11    WBC 05/09/2022 6.6    RBC 05/09/2022 5.25    Hemoglobin 05/09/2022 15.5    HCT 05/09/2022 45.1    MCV 05/09/2022 85.9    MCH 05/09/2022 29.5    MCHC 05/09/2022 34.4    RDW 05/09/2022 13.8    Platelets 05/09/2022 239    nRBC 05/09/2022 0.0    Troponin I (High Sensiti* 05/09/2022 3   No image results found. No results found.     Assessment & Plan OSA (obstructive sleep apnea) Sleep apnea is managed with CPAP and contributes to obesity and fatigue.  Zepbound's FDA approval for sleep apnea and its potential benefits were discussed.  Central adiposity Encouraged weight loss  Morbid obesity (HCC) Obesity with comorbidities such as sleep apnea and fatty liver disease persists despite previous weight loss efforts. Current weight is 275 lbs. Zepbound was prescribed but is pending insurance approval. Discussed its benefits, including improvements in fatty liver disease, sleep apnea, reduced cardiovascular risk, and weight loss. If insurance appeal fails, consider out-of-pocket payment through compound pharmacies ($150-$250/month). A comprehensive appeal letter will be sent to insurance for Zepbound coverage, including sleep apnea as a primary indication. Restless legs Managed with ropinirole. Refill the ropinirole prescription.  Allergic rhinitis, unspecified seasonality, unspecified trigger Allergy-related asthma is managed with albuterol. Refill the albuterol prescription.  NAFLD (nonalcoholic fatty liver disease) Encouraged weight loss  Liver function test abnormality  Elevated serum creatinine  Anxiety disorder, unspecified type  Uncomplicated asthma, unspecified asthma severity, unspecified whether persistent  Primary hypertension Blood pressure is 138/89 mmHg. Valsartan is prescribed but a dose was missed this morning. Blood pressure remains relatively well-controlled. Continue valsartan as prescribed.  Mixed hyperlipidemia   Multiple chronic conditions require ongoing management. Transfer all current prescriptions to [facility] [address] [facility] college, ensuring all prescriptions are refilled with 90 tabs and three refills.  Call Dr. Chauncy Passy office to request an urgent fax of lab results. Review lab results once received to determine any necessary treatment adjustments.      Orders Placed During this Encounter:  No orders of the defined types were placed in this encounter.  Meds ordered this encounter  Medications   albuterol  (VENTOLIN HFA) 108 (90 Base) MCG/ACT inhaler    Sig: Inhale 2 puffs into the lungs every 4 (four) hours as needed.    Dispense:  3 each    Refill:  3   tirzepatide (ZEPBOUND) 2.5 MG/0.5ML Pen    Sig: Inject 2.5 mg into the skin once a week.    Dispense:  2 mL    Refill:  11    Per Dr. Jon Billings via SecureChat, change to PENS from VIALS   rOPINIRole (REQUIP) 0.25 MG tablet    Sig: Take 1 tablet (0.25 mg total) by mouth 3 (three) times daily.    Dispense:  90 tablet    Refill:  4   valsartan-hydrochlorothiazide (DIOVAN-HCT) 320-25 MG tablet    Sig: Take 1 tablet by mouth daily.    Dispense:  90 tablet    Refill:  3   loratadine (CLARITIN) 10 MG tablet    Sig: Take 1 tablet (10 mg total) by mouth daily.    Dispense:  90 tablet    Refill:  3   montelukast (SINGULAIR) 10 MG tablet    Sig: Take 1 tablet (10 mg total) by mouth  daily.    Dispense:  90 tablet    Refill:  3   amLODipine (NORVASC) 10 MG tablet    Sig: Take 1 tablet (10 mg total) by mouth daily. At bedtime.    Dispense:  90 tablet    Refill:  3   buPROPion (WELLBUTRIN XL) 150 MG 24 hr tablet    Sig: Take 1 tablet (150 mg total) by mouth every morning.    Dispense:  90 tablet    Refill:  3   fenofibrate 160 MG tablet    Sig: Take 1 tablet (160 mg total) by mouth daily.    Dispense:  90 tablet    Refill:  3   rosuvastatin (CRESTOR) 20 MG tablet    Sig: Take 1 tablet (20 mg total) by mouth at bedtime. At bedtime.    Dispense:  90 tablet    Refill:  3   escitalopram (LEXAPRO) 20 MG tablet    Sig: Take 1 tablet (20 mg total) by mouth daily. At bedtime.    Dispense:  90 tablet    Refill:  3       **This document was synthesized by artificial intelligence (Abridge) using HIPAA-compliant recording of the clinical interaction;   We discussed the use of AI scribe software for clinical note transcription with the patient, who gave verbal consent to proceed.    Additional Info: This encounter employed state-of-the-art,  real-time, collaborative documentation. The patient actively reviewed and assisted in updating their electronic medical record on a shared screen, ensuring transparency and facilitating joint problem-solving for the problem list, overview, and plan. This approach promotes accurate, informed care. The treatment plan was discussed and reviewed in detail, including medication safety, potential side effects, and all patient questions. We confirmed understanding and comfort with the plan. Follow-up instructions were established, including contacting the office for any concerns, returning if symptoms worsen, persist, or new symptoms develop, and precautions for potential emergency department visits.

## 2023-12-26 NOTE — Assessment & Plan Note (Signed)
 Obesity with comorbidities such as sleep apnea and fatty liver disease persists despite previous weight loss efforts. Current weight is 275 lbs. Zepbound was prescribed but is pending insurance approval. Discussed its benefits, including improvements in fatty liver disease, sleep apnea, reduced cardiovascular risk, and weight loss. If insurance appeal fails, consider out-of-pocket payment through compound pharmacies ($150-$250/month). A comprehensive appeal letter will be sent to insurance for Zepbound coverage, including sleep apnea as a primary indication.

## 2023-12-26 NOTE — Assessment & Plan Note (Signed)
 Encouraged weight loss

## 2023-12-26 NOTE — Assessment & Plan Note (Signed)
 Blood pressure is 138/89 mmHg. Valsartan is prescribed but a dose was missed this morning. Blood pressure remains relatively well-controlled. Continue valsartan as prescribed.

## 2023-12-26 NOTE — Assessment & Plan Note (Signed)
 Managed with ropinirole. Refill the ropinirole prescription.

## 2023-12-26 NOTE — Assessment & Plan Note (Signed)
 Allergy-related asthma is managed with albuterol. Refill the albuterol prescription.

## 2023-12-26 NOTE — Assessment & Plan Note (Signed)
 Sleep apnea is managed with CPAP and contributes to obesity and fatigue. Zepbound's FDA approval for sleep apnea and its potential benefits were discussed.

## 2023-12-27 DIAGNOSIS — G4733 Obstructive sleep apnea (adult) (pediatric): Secondary | ICD-10-CM | POA: Diagnosis not present

## 2023-12-28 ENCOUNTER — Other Ambulatory Visit (HOSPITAL_COMMUNITY): Payer: Self-pay

## 2023-12-28 ENCOUNTER — Telehealth: Payer: Self-pay

## 2023-12-28 NOTE — Telephone Encounter (Signed)
 Pharmacy Patient Advocate Encounter   Received notification from Pt Calls Messages that prior authorization for Zepbound 2.5mg /0.82ml is required/requested.   Insurance verification completed.   The patient is insured through [employer] .   Per test claim: PA required; PA submitted to above mentioned insurance via CoverMyMeds Key/confirmation #/EOC [id number] Status is pending

## 2023-12-29 ENCOUNTER — Other Ambulatory Visit (HOSPITAL_COMMUNITY): Payer: Self-pay

## 2023-12-29 NOTE — Telephone Encounter (Signed)
 Pharmacy Patient Advocate Encounter  Received notification from [facility]  that Prior Authorization for Zepbound 2.5MG /0.5ML pen-injectors has been APPROVED from 12/28/23 to 12/27/24. Ran test claim, Copay is $30. This test claim was processed through [facility]- copay amounts may vary at other pharmacies due to pharmacy/plan contracts, or as the patient moves through the different stages of their insurance plan.   PA #/Case ID/Reference #:  [id number]

## 2023-12-30 ENCOUNTER — Encounter: Payer: Self-pay | Admitting: Internal Medicine

## 2024-01-20 DIAGNOSIS — G4733 Obstructive sleep apnea (adult) (pediatric): Secondary | ICD-10-CM | POA: Diagnosis not present

## 2024-01-27 DIAGNOSIS — G4733 Obstructive sleep apnea (adult) (pediatric): Secondary | ICD-10-CM | POA: Diagnosis not present

## 2024-02-05 ENCOUNTER — Encounter: Payer: Self-pay | Admitting: Internal Medicine

## 2024-02-12 ENCOUNTER — Encounter: Payer: Self-pay | Admitting: Internal Medicine

## 2024-02-12 ENCOUNTER — Ambulatory Visit: Admitting: Internal Medicine

## 2024-02-12 DIAGNOSIS — G4733 Obstructive sleep apnea (adult) (pediatric): Secondary | ICD-10-CM

## 2024-02-12 DIAGNOSIS — R110 Nausea: Secondary | ICD-10-CM | POA: Diagnosis not present

## 2024-02-12 DIAGNOSIS — E65 Localized adiposity: Secondary | ICD-10-CM | POA: Diagnosis not present

## 2024-02-12 MED ORDER — ZEPBOUND 5 MG/0.5ML ~~LOC~~ SOAJ: 5.0000 mg | SUBCUTANEOUS | 5 refills | Status: DC

## 2024-02-12 MED ORDER — TOPIRAMATE 25 MG PO TABS: 25.0000 mg | ORAL_TABLET | Freq: Two times a day (BID) | ORAL | 3 refills | Status: AC

## 2024-02-12 MED ORDER — ONDANSETRON 4 MG PO TBDP: 4.0000 mg | ORAL_TABLET | Freq: Three times a day (TID) | ORAL | 2 refills | Status: DC | PRN

## 2024-02-12 NOTE — Patient Instructions (Signed)
 VISIT SUMMARY:  Today, we discussed your weight loss management plan. We reviewed your current medications, side effects, and your concerns about physical activity due to knee pressure. We also considered your history of sleep apnea and the importance of maintaining muscle mass while losing weight. We have made some adjustments to your medication and provided recommendations for exercise and diet.  YOUR PLAN:  -MORBID OBESITY: Morbid obesity means having a body mass index (BMI) of 40 or higher, which can lead to various health issues. We will increase your Zepbound  dose to 5 mg and add Topiramate 25 mg to help reduce your caloric intake, especially sugar. It's important to incorporate resistance training to preserve muscle mass and improve body composition. We discussed potential side effects of Topiramate, such as nausea, and will provide anti-nausea medication if needed. Non-weight bearing exercises are recommended to avoid knee strain, and maintaining protein intake is crucial to preserve muscle mass.  -OBSTRUCTIVE SLEEP APNEA: Obstructive sleep apnea is a condition where your breathing stops and starts during sleep. Managing your weight is a key part of treating this condition. Continue with your weight loss efforts, as Zepbound  is approved for use under this diagnosis.  INSTRUCTIONS:  Please follow up with an E-visit if you need minor dose adjustments. Continue with the recommended exercises and dietary changes. If you experience any side effects from the new medication, contact us  immediately.  Sent in Zofran  in case of nausea.

## 2024-02-12 NOTE — Progress Notes (Signed)
 ==============================  [facility]: [id number]   -- Medical Office Visit --  Patient: Jason Benitez      Age: 53 y.o.       Sex:  male  Date:   02/12/2024 Today's Healthcare Provider: Anthon Kins, MD  ==============================   Chief Complaint: Weight Loss (Patient presents today for a weight loss.)  History of Present Illness 53 year old male who presents for weight loss management.  He is currently taking Zepbound   at a dose of 2.5 mg and experiences mild acid reflux and indigestion as side effects. He is considering increasing the dose to 5 mg to aid in weight loss.  He is not engaging in regular physical activity due to concerns about knee pressure but wants to lose weight before starting a more intensive exercise regimen. He is open to incorporating non-weight bearing exercises such as resistance training into his routine.  He has a history of sleep apnea, which is relevant to his current treatment plan. He is aware of the importance of maintaining muscle mass while losing weight and is considering dietary adjustments to reduce sugar intake while maintaining protein levels.  He has a history of anxiety, coronary artery disease, gout, and metabolic syndrome X, but these were not the focus of today's visit. His blood pressure and triglycerides are currently well-managed, and he did not report any issues with IBS or cholesterol during this visit.  Background Reviewed: Problem List: has DYSLIPIDEMIA; EXOGENOUS OBESITY; Allergic rhinitis; Asthma; LOW BACK PAIN; NEPHROLITHIASIS, HX OF; Allergies; Anxiety disorder; OSA (obstructive sleep apnea); Hypertension; Central adiposity; Hypertriglyceridemia; Metabolic syndrome X; Melanocytic nevus; IBS (irritable bowel syndrome); Restless legs; Mixed hyperlipidemia; Morbid obesity (HCC); Gout; GERD (gastroesophageal reflux disease); NAFLD (nonalcoholic fatty liver disease); Liver function  test abnormality; History of retinal detachment; Coronary artery calcification; Dilation of thoracic aorta (HCC); DDD (degenerative disc disease), cervical; Elevated serum creatinine; Prediabetes; Aneurysmal dilatation (HCC); Abnormal EKG; History of acute pyelonephritis; Impingement syndrome of right shoulder; Kidney stone; History of unstable angina; and Abnormal CT of the chest on their problem list. Medications:  has a current medication list which includes the following prescription(s): albuterol , amlodipine , bupropion , escitalopram , fenofibrate , loratadine , montelukast , ondansetron , ropinirole , rosuvastatin , zepbound , topiramate, and valsartan -hydrochlorothiazide .  Allergies:  has no known allergies.  Past Medical History:  has a past medical history of ALLERGIC RHINITIS (05/03/2007), Allergy, Anemia, Anxiety, ASTHMA (05/03/2007), Asthma, Chronic kidney disease, Depression, DYSLIPIDEMIA (05/03/2007), EXOGENOUS OBESITY (05/03/2007), History of kidney stones, History of sleep apnea, Hypertension, Insomnia (11/25/2023), LOW BACK PAIN (03/30/2008), NEPHROLITHIASIS, HX OF (03/30/2008), and Neuromuscular disorder (HCC). Past Surgical History:   has a past surgical history that includes No past surgeries and Extracorporeal shock wave lithotripsy (Left, 05/14/2017). Social History:   reports that he quit smoking about 33 years ago. His smoking use included cigarettes. He has never used smokeless tobacco. He reports current alcohol use of about 5.0 standard drinks of alcohol per week. He reports that he does not currently use drugs after having used the following drugs: Marijuana. Family History:  family history includes Cancer in his father and maternal grandfather; Diabetes in his father and mother; Early death in his father; Heart attack in his maternal grandfather; Hyperlipidemia in his mother; Hypertension in his mother. Depression Screen and Health Maintenance:    11/25/2023   10:06 AM  PHQ 2/9 Scores   PHQ - 2 Score 0   Medication Reconciliation: Current Outpatient Medications on File Prior to Visit  Medication Sig  albuterol  (VENTOLIN  HFA) 108 (90 Base) MCG/ACT inhaler Inhale 2 puffs into the lungs every 4 (four) hours as needed.   amLODipine  (NORVASC ) 10 MG tablet Take 1 tablet (10 mg total) by mouth daily. At bedtime.   buPROPion  (WELLBUTRIN  XL) 150 MG 24 hr tablet Take 1 tablet (150 mg total) by mouth every morning.   escitalopram  (LEXAPRO ) 20 MG tablet Take 1 tablet (20 mg total) by mouth daily. At bedtime.   fenofibrate  160 MG tablet Take 1 tablet (160 mg total) by mouth daily.   loratadine  (CLARITIN ) 10 MG tablet Take 1 tablet (10 mg total) by mouth daily.   montelukast  (SINGULAIR ) 10 MG tablet Take 1 tablet (10 mg total) by mouth daily.   rOPINIRole  (REQUIP ) 0.25 MG tablet Take 1 tablet (0.25 mg total) by mouth 3 (three) times daily.   rosuvastatin  (CRESTOR ) 20 MG tablet Take 1 tablet (20 mg total) by mouth at bedtime. At bedtime.   valsartan -hydrochlorothiazide  (DIOVAN -HCT) 320-25 MG tablet Take 1 tablet by mouth daily.   No current facility-administered medications on file prior to visit.   Medications Discontinued During This Encounter  Medication Reason   tirzepatide  (ZEPBOUND ) 2.5 MG/0.5ML Pen Dose change     Physical Exam:    02/12/2024    8:51 AM 12/25/2023    1:32 PM 11/25/2023    9:58 AM  Vitals with BMI  Height 5\' 10"  5\' 10"  5\' 10"   Weight 276 lbs 13 oz 275 lbs 278 lbs 3 oz  BMI 39.72 39.46 39.92  Systolic 126 138 782  Diastolic 84 89 82  Pulse 73 71 75   Wt Readings from Last 10 Encounters:  02/12/24 276 lb 12.8 oz (125.6 kg)  12/25/23 275 lb (124.7 kg)  11/25/23 278 lb 3.2 oz (126.2 kg)  05/09/22 275 lb (124.7 kg)  11/11/21 277 lb (125.6 kg)  06/27/21 277 lb (125.6 kg)  12/30/19 272 lb (123.4 kg)  12/16/19 272 lb (123.4 kg)  05/14/17 256 lb 6.4 oz (116.3 kg)  01/21/16 250 lb (113.4 kg)  Vital signs reviewed.  Nursing notes reviewed. Weight trend  reviewed. Physical Exam  Physical Exam General Appearance:  No acute distress appreciable.   Well-groomed, healthy-appearing male.  Well proportioned with no abnormal fat distribution.  Good muscle tone. Truncal adiposity  Pulmonary:  Normal work of breathing at rest, no respiratory distress apparent. SpO2: 97 %  Musculoskeletal: All extremities are intact.  Neurological:  Awake, alert, oriented, and engaged.  No obvious focal neurological deficits or cognitive impairments.  Sensorium seems unclouded.   Speech is clear and coherent with logical content. Psychiatric:  Appropriate mood, pleasant and cooperative demeanor, thoughtful and engaged during the exam     Assessment & Plan Morbid obesity (HCC) He exhibits morbid obesity with resistance to initial weight loss efforts on Zepbound  2.5 mg. No significant side effects reported, except for mild acid reflux and indigestion. Emphasized the importance of resistance training, as 95% of individuals who maintain weight loss incorporate it. Discussed alternative medications and decided to proceed with Topiramate to reduce caloric intake. Increase Zepbound  to 5 mg. Add Topiramate 25 mg to reduce caloric intake, particularly sugar calories. Encourage resistance training to preserve muscle mass and improve body composition. Discuss potential side effects of Topiramate, including nausea, and provide anti-nausea medication if needed. Emphasize maintaining protein intake to preserve muscle mass. Recommend non-weight bearing exercises to avoid knee strain. Consider E-visit for minor dose adjustments if needed. OSA (obstructive sleep apnea) Obstructive sleep apnea is managed with  weight loss efforts. Zepbound  is approved for use under this diagnosis. Continue weight loss efforts as part of the management plan for obstructive sleep apnea.      Orders Placed During this Encounter:   Meds ordered this encounter  Medications   tirzepatide  (ZEPBOUND ) 5 MG/0.5ML Pen     Sig: Inject 5 mg into the skin once a week.    Dispense:  2 mL    Refill:  5   topiramate (TOPAMAX) 25 MG tablet    Sig: Take 1 tablet (25 mg total) by mouth 2 (two) times daily.    Dispense:  90 tablet    Refill:  3   ondansetron  (ZOFRAN -ODT) 4 MG disintegrating tablet    Sig: Take 1 tablet (4 mg total) by mouth every 8 (eight) hours as needed for nausea or vomiting.    Dispense:  20 tablet    Refill:  2    Zofran  in case of Zepbound  nausea.           Additional Info: This encounter employed state-of-the-art, real-time, collaborative documentation. The patient actively reviewed and assisted in updating their electronic medical record on a shared screen, ensuring transparency and facilitating joint problem-solving for the problem list, overview, and plan. This approach promotes accurate, informed care. The treatment plan was discussed and reviewed in detail, including medication safety, potential side effects, and all patient questions. We confirmed understanding and comfort with the plan. Follow-up instructions were established, including contacting the office for any concerns, returning if symptoms worsen, persist, or new symptoms develop, and precautions for potential emergency department visits.   This document was synthesized by artificial intelligence ([employer]) using HIPAA-compliant recording of the clinical interaction;   We discussed the use of AI scribe software for clinical note transcription with the patient, who gave verbal consent to proceed.

## 2024-02-12 NOTE — Assessment & Plan Note (Signed)
 He exhibits morbid obesity with resistance to initial weight loss efforts on Zepbound  2.5 mg. No significant side effects reported, except for mild acid reflux and indigestion. Emphasized the importance of resistance training, as 95% of individuals who maintain weight loss incorporate it. Discussed alternative medications and decided to proceed with Topiramate to reduce caloric intake. Increase Zepbound  to 5 mg. Add Topiramate 25 mg to reduce caloric intake, particularly sugar calories. Encourage resistance training to preserve muscle mass and improve body composition. Discuss potential side effects of Topiramate, including nausea, and provide anti-nausea medication if needed. Emphasize maintaining protein intake to preserve muscle mass. Recommend non-weight bearing exercises to avoid knee strain. Consider E-visit for minor dose adjustments if needed.

## 2024-02-12 NOTE — Assessment & Plan Note (Signed)
 Obstructive sleep apnea is managed with weight loss efforts. Zepbound  is approved for use under this diagnosis. Continue weight loss efforts as part of the management plan for obstructive sleep apnea.

## 2024-02-19 DIAGNOSIS — G4733 Obstructive sleep apnea (adult) (pediatric): Secondary | ICD-10-CM | POA: Diagnosis not present

## 2024-02-26 DIAGNOSIS — G4733 Obstructive sleep apnea (adult) (pediatric): Secondary | ICD-10-CM | POA: Diagnosis not present

## 2024-03-21 DIAGNOSIS — G4733 Obstructive sleep apnea (adult) (pediatric): Secondary | ICD-10-CM | POA: Diagnosis not present

## 2024-03-28 DIAGNOSIS — G4733 Obstructive sleep apnea (adult) (pediatric): Secondary | ICD-10-CM | POA: Diagnosis not present

## 2024-04-26 DIAGNOSIS — G4733 Obstructive sleep apnea (adult) (pediatric): Secondary | ICD-10-CM | POA: Diagnosis not present

## 2024-04-27 DIAGNOSIS — G4733 Obstructive sleep apnea (adult) (pediatric): Secondary | ICD-10-CM | POA: Diagnosis not present

## 2024-05-15 ENCOUNTER — Other Ambulatory Visit: Payer: Self-pay | Admitting: Internal Medicine

## 2024-05-15 DIAGNOSIS — G2581 Restless legs syndrome: Secondary | ICD-10-CM

## 2024-05-28 DIAGNOSIS — G4733 Obstructive sleep apnea (adult) (pediatric): Secondary | ICD-10-CM | POA: Diagnosis not present

## 2024-06-28 DIAGNOSIS — G4733 Obstructive sleep apnea (adult) (pediatric): Secondary | ICD-10-CM | POA: Diagnosis not present

## 2024-08-19 ENCOUNTER — Ambulatory Visit: Admitting: Internal Medicine

## 2024-08-22 ENCOUNTER — Other Ambulatory Visit: Payer: Self-pay | Admitting: Internal Medicine

## 2024-08-22 DIAGNOSIS — G4733 Obstructive sleep apnea (adult) (pediatric): Secondary | ICD-10-CM

## 2024-08-22 DIAGNOSIS — E65 Localized adiposity: Secondary | ICD-10-CM

## 2024-08-30 ENCOUNTER — Other Ambulatory Visit: Payer: Self-pay | Admitting: Internal Medicine

## 2024-08-30 DIAGNOSIS — R110 Nausea: Secondary | ICD-10-CM

## 2024-09-13 ENCOUNTER — Ambulatory Visit: Admitting: Internal Medicine

## 2024-09-13 ENCOUNTER — Ambulatory Visit: Payer: Self-pay | Admitting: Internal Medicine

## 2024-09-13 ENCOUNTER — Encounter: Payer: Self-pay | Admitting: Internal Medicine

## 2024-09-13 VITALS — BP 120/78 | HR 78 | Temp 98.0°F | Ht 70.0 in | Wt 256.6 lb

## 2024-09-13 DIAGNOSIS — F32A Depression, unspecified: Secondary | ICD-10-CM

## 2024-09-13 DIAGNOSIS — G2581 Restless legs syndrome: Secondary | ICD-10-CM

## 2024-09-13 DIAGNOSIS — G4733 Obstructive sleep apnea (adult) (pediatric): Secondary | ICD-10-CM | POA: Diagnosis not present

## 2024-09-13 DIAGNOSIS — I10 Essential (primary) hypertension: Secondary | ICD-10-CM | POA: Diagnosis not present

## 2024-09-13 DIAGNOSIS — D492 Neoplasm of unspecified behavior of bone, soft tissue, and skin: Secondary | ICD-10-CM | POA: Insufficient documentation

## 2024-09-13 DIAGNOSIS — L578 Other skin changes due to chronic exposure to nonionizing radiation: Secondary | ICD-10-CM

## 2024-09-13 DIAGNOSIS — L989 Disorder of the skin and subcutaneous tissue, unspecified: Secondary | ICD-10-CM | POA: Insufficient documentation

## 2024-09-13 LAB — CBC WITH DIFFERENTIAL/PLATELET
Basophils Absolute: 0.1 K/uL (ref 0.0–0.1)
Basophils Relative: 1.2 % (ref 0.0–3.0)
Eosinophils Absolute: 0.2 K/uL (ref 0.0–0.7)
Eosinophils Relative: 2.8 % (ref 0.0–5.0)
HCT: 40.9 % (ref 39.0–52.0)
Hemoglobin: 13.8 g/dL (ref 13.0–17.0)
Lymphocytes Relative: 24 % (ref 12.0–46.0)
Lymphs Abs: 1.3 K/uL (ref 0.7–4.0)
MCHC: 33.9 g/dL (ref 30.0–36.0)
MCV: 86.2 fl (ref 78.0–100.0)
Monocytes Absolute: 0.6 K/uL (ref 0.1–1.0)
Monocytes Relative: 10 % (ref 3.0–12.0)
Neutro Abs: 3.4 K/uL (ref 1.4–7.7)
Neutrophils Relative %: 62 % (ref 43.0–77.0)
Platelets: 227 K/uL (ref 150.0–400.0)
RBC: 4.74 Mil/uL (ref 4.22–5.81)
RDW: 13.9 % (ref 11.5–15.5)
WBC: 5.5 K/uL (ref 4.0–10.5)

## 2024-09-13 LAB — COMPREHENSIVE METABOLIC PANEL WITH GFR
ALT: 26 U/L (ref 0–53)
AST: 36 U/L (ref 0–37)
Albumin: 4.3 g/dL (ref 3.5–5.2)
Alkaline Phosphatase: 32 U/L — ABNORMAL LOW (ref 39–117)
BUN: 22 mg/dL (ref 6–23)
CO2: 31 meq/L (ref 19–32)
Calcium: 9.8 mg/dL (ref 8.4–10.5)
Chloride: 104 meq/L (ref 96–112)
Creatinine, Ser: 1.18 mg/dL (ref 0.40–1.50)
GFR: 70.4 mL/min (ref >60.00)
Glucose, Bld: 103 mg/dL — ABNORMAL HIGH (ref 70–99)
Potassium: 4 meq/L (ref 3.5–5.1)
Sodium: 142 meq/L (ref 135–145)
Total Bilirubin: 0.4 mg/dL (ref 0.2–1.2)
Total Protein: 6.6 g/dL (ref 6.0–8.3)

## 2024-09-13 LAB — FERRITIN: Ferritin: 65.5 ng/mL (ref 22.0–322.0)

## 2024-09-13 MED ORDER — GABAPENTIN 300 MG PO CAPS: 300.0000 mg | ORAL_CAPSULE | Freq: Every day | ORAL | 3 refills | Status: AC

## 2024-09-13 MED ORDER — ZEPBOUND 7.5 MG/0.5ML ~~LOC~~ SOAJ: 7.5000 mg | SUBCUTANEOUS | 12 refills | Status: AC

## 2024-09-13 NOTE — Assessment & Plan Note (Signed)
 Morbid obesity   Weight has decreased from 278 lbs to an average of 250 lbs, with a target of 225-230 lbs. Waist girth is currently 47 inches, aiming to reduce to 44-45 inches to lower cardiovascular risk. Zepbound  has been effective but may need adjustment due to a plateau. Increased Zepbound  to 7.5 mg. Emphasis on resistance training 2-3 times a week using TRX or rings and a low-carb diet with one meal containing carbs per day.

## 2024-09-13 NOTE — Assessment & Plan Note (Signed)
 Obstructive sleep apnea   Continues using CPAP machine, which aids weight management and overall health. Weight loss may reduce CPAP need, but continued use is recommended to prevent cortisol release and fat retention.

## 2024-09-13 NOTE — Patient Instructions (Signed)
 It was a pleasure seeing you today! Your health and satisfaction are our top priorities.  Bernardino Cone, MD  VISIT SUMMARY: During your visit, we discussed your progress with weight loss, management of restless legs syndrome, sleep apnea, hypertension, depression, and a skin lesion. We made some adjustments to your medications and discussed lifestyle changes to help you reach your health goals.  YOUR PLAN: -MORBID OBESITY: Morbid obesity means having a very high amount of body fat. Your weight has decreased from 278 lbs to an average of 250 lbs, with a target of 225-230 lbs. We increased your Zepbound  dose to 7.5 mg to help overcome the plateau. Continue with your low-carb diet and try to incorporate resistance training 2-3 times a week using TRX or rings.  -OBSTRUCTIVE SLEEP APNEA: Obstructive sleep apnea is a condition where your breathing stops and starts during sleep. Continue using your CPAP machine as it helps with weight management and overall health. Weight loss may reduce your need for the CPAP, but for now, continue its use to prevent cortisol release and fat retention.  -RESTLESS LEGS SYNDROME: Restless legs syndrome is a condition that causes an uncontrollable urge to move your legs, usually due to an uncomfortable sensation. We have stopped Requip  and started you on gabapentin  at bedtime. We also ordered iron studies and suggested a break from your statin to see if it affects your symptoms.  -ESSENTIAL HYPERTENSION: Essential hypertension is high blood pressure with no identifiable cause. Your blood pressure is being managed with your current medications. We ordered a basic metabolic panel to monitor your condition.  -DEPRESSION: Depression is a mood disorder that causes persistent feelings of sadness and loss of interest. Your depression is well-managed with your current medications. You reported doing well despite external stressors and prefer to continue your current  regimen.  -NEOPLASM OF UNCERTAIN BEHAVIOR OF SKIN: A neoplasm of uncertain behavior of the skin is a skin lesion that needs further evaluation to determine if it is benign or malignant. You have a large skin lesion on your buttocks, possibly a skin tag. We referred you to dermatology for evaluation and management.  INSTRUCTIONS: Please follow up with the dermatology referral for your skin lesion. Continue with your current medications for hypertension and depression. Start taking gabapentin  at bedtime for restless legs syndrome and stop taking Requip . We will also conduct iron studies and a basic metabolic panel. Maintain your low-carb diet and try to incorporate resistance training 2-3 times a week. Continue using your CPAP machine for sleep apnea. We will reassess your progress in your next visit.  Your Providers PCP: Cone Bernardino MATSU, MD,  [phone]) Referring Provider: Cone Bernardino MATSU, MD,  [phone]) Care Team Provider: Lonni Slain, MD,  [phone])  NEXT STEPS: [x]  Early Intervention: Schedule sooner appointment, call our on-call services, or go to emergency room if there is any significant Increase in pain or discomfort New or worsening symptoms Sudden or severe changes in your health [x]  Flexible Follow-Up: We recommend a Return in about 2 months (around 11/13/2024) for annual preventive care visit. for optimal routine care. This allows for progress monitoring and treatment adjustments. [x]  Preventive Care: Schedule your annual preventive care visit! It's typically covered by insurance and helps identify potential health issues early. [x]  Lab & X-ray Appointments: Incomplete tests scheduled today, or call to schedule. X-rays: [facility] (M-F, 8:30am-noon or 1pm-5pm). [x]  Medical Information Release: Sign a release form at front desk to obtain relevant medical information we don't have.  MAKING THE MOST OF OUR FOCUSED 20 MINUTE APPOINTMENTS: [x]    Clearly state your top concerns at the beginning of the visit to focus our discussion [x]   If you anticipate you will need more time, please inform the front desk during scheduling - we can book multiple appointments in the same week. [x]   If you have transportation problems- use our convenient video appointments or ask about transportation support. [x]   We can get down to business faster if you use MyChart to update information before the visit and submit non-urgent questions before your visit. Thank you for taking the time to provide details through MyChart.  Let our nurse know and she can import this information into your encounter documents.  Arrival and Wait Times: [x]   Arriving on time ensures that everyone receives prompt attention. [x]   Early morning (8a) and afternoon (1p) appointments tend to have shortest wait times. [x]   Unfortunately, we cannot delay appointments for late arrivals or hold slots during phone calls.  Getting Answers and Following Up [x]   Simple Questions & Concerns: For quick questions or basic follow-up after your visit, reach us  at [phone] or MyChart messaging. [x]   Complex Concerns: If your concern is more complex, scheduling an appointment might be best. Discuss this with the staff to find the most suitable option. [x]   Lab & Imaging Results: We'll contact you directly if results are abnormal or you don't use MyChart. Most normal results will be on MyChart within 2-3 business days, with a review message from Dr. Jesus. Haven't heard back in 2 weeks? Need results sooner? Contact us  at [phone]. [x]   Referrals: Our referral coordinator will manage specialist referrals. The specialist's office should contact you within 2 weeks to schedule an appointment. Call us  if you haven't heard from them after 2 weeks.  Staying Connected [x]   MyChart: Activate your MyChart for the fastest way to access results and message us . See the last page of this paperwork for  instructions on how to activate.  Bring to Your Next Appointment [x]   Medications: Please bring all your medication bottles to your next appointment to ensure we have an accurate record of your prescriptions. [x]   Health Diaries: If you're monitoring any health conditions at home, keeping a diary of your readings can be very helpful for discussions at your next appointment.  Billing [x]   X-ray & Lab Orders: These are billed by separate companies. Contact the invoicing company directly for questions or concerns. [x]   Visit Charges: Discuss any billing inquiries with our administrative services team.  Your Satisfaction Matters [x]   Share Your Experience: We strive for your satisfaction! If you have any complaints, or preferably compliments, please let Dr. Jesus know directly or contact our Practice Administrators, Manuelita Rubin or [employer], by asking at the front desk.   Reviewing Your Records [x]   Review this early draft of your clinical encounter notes below and the final encounter summary tomorrow on MyChart after its been completed.  All orders placed so far are visible here: OSA (obstructive sleep apnea) -     Zepbound ; Inject 7.5 mg into the skin once a week.  Dispense: 2 mL; Refill: 12  Restless legs -     Ferritin -     Gabapentin ; Take 1 capsule (300 mg total) by mouth at bedtime.  Dispense: 30 capsule; Refill: 3  Primary hypertension -     CBC with Differential/Platelet -     Comprehensive metabolic panel with GFR  Sun-damaged skin  Skin lesion -     Ambulatory referral to Dermatology  Neoplasm of perianal skin  Morbid obesity (HCC)  Depressive disorder

## 2024-09-13 NOTE — Assessment & Plan Note (Signed)
 Blood pressure management is ongoing with current medications. Ordered basic metabolic panel for monitoring.

## 2024-09-13 NOTE — Progress Notes (Signed)
 ==============================  [facility]: [phone]   -- Medical Office Visit --  Patient: Jason Benitez      Age: 53 y.o.       Sex:  male  Date:   09/13/2024 Today's Healthcare Provider: Bernardino KANDICE Cone, MD  ==============================   Chief Complaint: Obesity, restless legs, and skin check (Would like referral to dermo for skin check )  Discussed the use of AI scribe software for clinical note transcription with the patient, who gave verbal consent to proceed.  History of Present Illness 53 year old male with obesity and restless legs syndrome who presents for a six-month follow-up visit.  He has experienced a weight reduction from 278 pounds to an average of 250 pounds over the past few months, although there has been a slight recent increase. His target weight is 225-230 pounds. He is currently taking Zepbound , which has been beneficial. He follows a diet that is very protein-heavy and tries to avoid bad carbohydrates like bread. Typical meals include a turkey sandwich wrap with lettuce for lunch and a protein drink for breakfast, with a normal dinner. He is down to two meals a day, with only one containing carbohydrates. He has not been engaging in regular exercise, although he is active, and wants to incorporate more resistance training into his routine, avoiding high-impact activities like running.  He has been experiencing restless legs syndrome, which has not improved with Requip  (ropinirole ). He describes jumping around in bed at night, which disturbs his wife. He suspects the condition may have worsened with statin use. He has not tried stopping the statin yet and is unsure if his iron levels have been checked.  He has a history of sleep apnea and continues to use a CPAP machine.  He mentions a skin lesion that has grown on his buttock, described as the size of the end of his finger and bulbous. He has not had a skin check in a  long time and is seeking a referral to a dermatologist.  He has a history of hypertension and depression, for which he is on medication. He feels okay despite stress at work and home, and wishes to continue his current medications.  Wt Readings from Last 50 Encounters:  09/13/24 256 lb 9.6 oz (116.4 kg)  02/12/24 276 lb 12.8 oz (125.6 kg)  12/25/23 275 lb (124.7 kg)  11/25/23 278 lb 3.2 oz (126.2 kg)  05/09/22 275 lb (124.7 kg)  11/11/21 277 lb (125.6 kg)  06/27/21 277 lb (125.6 kg)  12/30/19 272 lb (123.4 kg)  12/16/19 272 lb (123.4 kg)  05/14/17 256 lb 6.4 oz (116.3 kg)  01/21/16 250 lb (113.4 kg)  07/19/14 251 lb (113.9 kg)  03/09/14 248 lb (112.5 kg)  03/15/13 236 lb (107 kg)  03/31/11 258 lb (117 kg)   BMI Readings from Last 50 Encounters:  09/13/24 36.82 kg/m  02/12/24 39.72 kg/m  12/25/23 39.46 kg/m  11/25/23 39.92 kg/m  05/09/22 39.46 kg/m  11/11/21 39.18 kg/m  06/27/21 38.63 kg/m  12/30/19 39.03 kg/m  12/16/19 39.03 kg/m  05/14/17 36.79 kg/m  01/21/16 35.87 kg/m  07/19/14 36.01 kg/m  03/09/14 35.58 kg/m  03/15/13 33.86 kg/m  03/31/11 35.98 kg/m     Background Reviewed: Problem List: has DYSLIPIDEMIA; EXOGENOUS OBESITY; Allergic rhinitis; Asthma; LOW BACK PAIN; NEPHROLITHIASIS, HX OF; Allergies; Anxiety disorder; OSA (obstructive sleep apnea); Hypertension; Central adiposity; Hypertriglyceridemia; Metabolic syndrome X; Melanocytic nevus; IBS (irritable bowel syndrome); Restless legs; Mixed hyperlipidemia;  Morbid obesity (HCC); Gout; GERD (gastroesophageal reflux disease); NAFLD (nonalcoholic fatty liver disease); Liver function test abnormality; History of retinal detachment; Coronary artery calcification; Dilation of thoracic aorta; DDD (degenerative disc disease), cervical; Elevated serum creatinine; Prediabetes; Aneurysmal dilatation; Abnormal EKG; History of acute pyelonephritis; Impingement syndrome of right shoulder; Kidney stone; History of  unstable angina; Abnormal CT of the chest; Neoplasm of perianal skin; and Skin lesion on their problem list. Past Medical History:  has a past medical history of ALLERGIC RHINITIS (05/03/2007), Allergy, Anemia, Anxiety, ASTHMA (05/03/2007), Asthma, Chronic kidney disease, Depression, DYSLIPIDEMIA (05/03/2007), EXOGENOUS OBESITY (05/03/2007), History of kidney stones, History of sleep apnea, Hypertension, Insomnia (11/25/2023), LOW BACK PAIN (03/30/2008), NEPHROLITHIASIS, HX OF (03/30/2008), and Neuromuscular disorder (HCC). Past Surgical History:   has a past surgical history that includes No past surgeries and Extracorporeal shock wave lithotripsy (Left, 05/14/2017). Social History:   reports that he quit smoking about 33 years ago. His smoking use included cigarettes. He has never used smokeless tobacco. He reports current alcohol use of about 5.0 standard drinks of alcohol per week. He reports that he does not currently use drugs after having used the following drugs: Marijuana. Family History:  family history includes Cancer in his father and maternal grandfather; Diabetes in his father and mother; Early death in his father; Heart attack in his maternal grandfather; Hyperlipidemia in his mother; Hypertension in his mother. Allergies:  has no known allergies.   Medication Reconciliation: Current Outpatient Medications on File Prior to Visit  Medication Sig   albuterol  (VENTOLIN  HFA) 108 (90 Base) MCG/ACT inhaler Inhale 2 puffs into the lungs every 4 (four) hours as needed.   amLODipine  (NORVASC ) 10 MG tablet Take 1 tablet (10 mg total) by mouth daily. At bedtime.   buPROPion  (WELLBUTRIN  XL) 150 MG 24 hr tablet Take 1 tablet (150 mg total) by mouth every morning.   escitalopram  (LEXAPRO ) 20 MG tablet Take 1 tablet (20 mg total) by mouth daily. At bedtime.   fenofibrate  160 MG tablet Take 1 tablet (160 mg total) by mouth daily.   loratadine  (CLARITIN ) 10 MG tablet Take 1 tablet (10 mg total) by mouth  daily.   montelukast  (SINGULAIR ) 10 MG tablet Take 1 tablet (10 mg total) by mouth daily.   ondansetron  (ZOFRAN -ODT) 4 MG disintegrating tablet TAKE 1 TABLET BY MOUTH EVERY 8 HOURS AS NEEDED FOR NAUSEA AND VOMITING   rosuvastatin  (CRESTOR ) 20 MG tablet Take 1 tablet (20 mg total) by mouth at bedtime. At bedtime.   topiramate  (TOPAMAX ) 25 MG tablet Take 1 tablet (25 mg total) by mouth 2 (two) times daily. (Patient not taking: Reported on 09/13/2024)   valsartan -hydrochlorothiazide  (DIOVAN -HCT) 320-25 MG tablet Take 1 tablet by mouth daily.   No current facility-administered medications on file prior to visit.   Medications Discontinued During This Encounter  Medication Reason   tirzepatide  (ZEPBOUND ) 5 MG/0.5ML Pen Not covered by the pt's insurance   rOPINIRole  (REQUIP ) 0.25 MG tablet Completed Course     Physical Exam:    09/13/2024    8:27 AM 02/12/2024    8:51 AM 12/25/2023    1:32 PM  Vitals with BMI  Height 5' 10 5' 10 5' 10  Weight 256 lbs 10 oz 276 lbs 13 oz 275 lbs  BMI 36.82 39.72 39.46  Systolic 120 126 861  Diastolic 78 84 89  Pulse 78 73 71  Vital signs reviewed.  Nursing notes reviewed. Weight trend reviewed. Physical Activity: Inactive (11/11/2021)   Exercise Vital Sign  Days of Exercise per Week: 0 days    Minutes of Exercise per Session: 0 min   General Appearance:  No acute distress appreciable.   Well-groomed, healthy-appearing male.  Well proportioned with no abnormal fat distribution.  Good muscle tone. Pulmonary:  Normal work of breathing at rest, no respiratory distress apparent. SpO2: 98 %  Musculoskeletal: All extremities are intact.  Neurological:  Awake, alert, oriented, and engaged.  No obvious focal neurological deficits or cognitive impairments.  Sensorium seems unclouded.   Speech is clear and coherent with logical content. Psychiatric:  Appropriate mood, pleasant and cooperative demeanor, thoughtful and engaged during the exam   Verbalized to  patient: Physical Exam MEASUREMENTS: Weight- 250. SKIN: Skin without signs of melanoma.   Results:   Verbalized to patient: Results DIAGNOSTIC EKG: previous ischemia     11/25/2023   10:06 AM  PHQ 2/9 Scores  PHQ - 2 Score 0     Office Visit on 09/13/2024  Component Date Value Ref Range Status   Ferritin 09/13/2024 65.5  22.0 - 322.0 ng/mL Final   WBC 09/13/2024 5.5  4.0 - 10.5 K/uL Final   RBC 09/13/2024 4.74  4.22 - 5.81 Mil/uL Final   Hemoglobin 09/13/2024 13.8  13.0 - 17.0 g/dL Final   HCT 88/74/7974 40.9  39.0 - 52.0 % Final   MCV 09/13/2024 86.2  78.0 - 100.0 fl Final   MCHC 09/13/2024 33.9  30.0 - 36.0 g/dL Final   RDW 88/74/7974 13.9  11.5 - 15.5 % Final   Platelets 09/13/2024 227.0  150.0 - 400.0 K/uL Final   Neutrophils Relative % 09/13/2024 62.0  43.0 - 77.0 % Final   Lymphocytes Relative 09/13/2024 24.0  12.0 - 46.0 % Final   Monocytes Relative 09/13/2024 10.0  3.0 - 12.0 % Final   Eosinophils Relative 09/13/2024 2.8  0.0 - 5.0 % Final   Basophils Relative 09/13/2024 1.2  0.0 - 3.0 % Final   Neutro Abs 09/13/2024 3.4  1.4 - 7.7 K/uL Final   Lymphs Abs 09/13/2024 1.3  0.7 - 4.0 K/uL Final   Monocytes Absolute 09/13/2024 0.6  0.1 - 1.0 K/uL Final   Eosinophils Absolute 09/13/2024 0.2  0.0 - 0.7 K/uL Final   Basophils Absolute 09/13/2024 0.1  0.0 - 0.1 K/uL Final   Sodium 09/13/2024 142  135 - 145 mEq/L Final   Potassium 09/13/2024 4.0  3.5 - 5.1 mEq/L Final   Chloride 09/13/2024 104  96 - 112 mEq/L Final   CO2 09/13/2024 31  19 - 32 mEq/L Final   Glucose, Bld 09/13/2024 103 (H)  70 - 99 mg/dL Final   BUN 88/74/7974 22  6 - 23 mg/dL Final   Creatinine, Ser 09/13/2024 1.18  0.40 - 1.50 mg/dL Final   Total Bilirubin 09/13/2024 0.4  0.2 - 1.2 mg/dL Final   Alkaline Phosphatase 09/13/2024 32 (L)  39 - 117 U/L Final   AST 09/13/2024 36  0 - 37 U/L Final   ALT 09/13/2024 26  0 - 53 U/L Final   Total Protein 09/13/2024 6.6  6.0 - 8.3 g/dL Final   Albumin  88/74/7974 4.3  3.5 - 5.2 g/dL Final   GFR 88/74/7974 70.40  >60.00 mL/min Final   Calcium  09/13/2024 9.8  8.4 - 10.5 mg/dL Final  Office Visit on 12/25/2023  Component Date Value Ref Range Status   PSA, Total 11/13/2023 0.30   Final       ASSESSMENT & PLAN   Assessment & Plan OSA (  obstructive sleep apnea) Obstructive sleep apnea   Continues using CPAP machine, which aids weight management and overall health. Weight loss may reduce CPAP need, but continued use is recommended to prevent cortisol release and fat retention. Restless legs Symptoms persist despite Requip  use, possibly due to statin use and iron deficiency. Discontinued Requip  and initiated gabapentin  at bedtime. Ordered iron studies and initiated a statin holiday to assess impact on symptoms. Primary hypertension Blood pressure management is ongoing with current medications. Ordered basic metabolic panel for monitoring. Sun-damaged skin Skin lesion Neoplasm of perianal skin Neoplasm of uncertain behavior of skin   Large skin lesion on buttocks, possibly a skin tag. Referred to dermatology for evaluation and management. Morbid obesity (HCC) Morbid obesity   Weight has decreased from 278 lbs to an average of 250 lbs, with a target of 225-230 lbs. Waist girth is currently 47 inches, aiming to reduce to 44-45 inches to lower cardiovascular risk. Zepbound  has been effective but may need adjustment due to a plateau. Increased Zepbound  to 7.5 mg. Emphasis on resistance training 2-3 times a week using TRX or rings and a low-carb diet with one meal containing carbs per day. Depressive disorder Depression   Well-managed with current medications. Reports doing well despite external stressors and prefers to continue current regimen.  ORDER ASSOCIATIONS  #   DIAGNOSIS / CONDITION ICD-10 ENCOUNTER ORDER     ICD-10-CM   1. OSA (obstructive sleep apnea)  G47.33 tirzepatide  (ZEPBOUND ) 7.5 MG/0.5ML Pen    2. Restless legs  G25.81  Ferritin    gabapentin  (NEURONTIN ) 300 MG capsule    3. Primary hypertension  I10 CBC with Differential/Platelet    Comprehensive metabolic panel with GFR    4. Sun-damaged skin  L57.8     5. Skin lesion  L98.9 Ambulatory referral to Dermatology    6. Neoplasm of perianal skin  D49.2     7. Morbid obesity (HCC)  E66.01     8. Depressive disorder  F32.A          Orders Placed in Encounter:   Lab Orders         Ferritin         CBC with Differential/Platelet         Comprehensive metabolic panel with GFR    Referral Orders         Ambulatory referral to Dermatology    Meds ordered this encounter  Medications   tirzepatide  (ZEPBOUND ) 7.5 MG/0.5ML Pen    Sig: Inject 7.5 mg into the skin once a week.    Dispense:  2 mL    Refill:  12   gabapentin  (NEURONTIN ) 300 MG capsule    Sig: Take 1 capsule (300 mg total) by mouth at bedtime.    Dispense:  30 capsule    Refill:  3     This document was synthesized by artificial intelligence ([employer]) using HIPAA-compliant recording of the clinical interaction;   We discussed the use of AI scribe software for clinical note transcription with the patient, who gave verbal consent to proceed. additional Info: This encounter employed state-of-the-art, real-time, collaborative documentation. The patient actively reviewed and assisted in updating their electronic medical record on a shared screen, ensuring transparency and facilitating joint problem-solving for the problem list, overview, and plan. This approach promotes accurate, informed care. The treatment plan was discussed and reviewed in detail, including medication safety, potential side effects, and all patient questions. We confirmed understanding and comfort with the plan. Follow-up instructions were  established, including contacting the office for any concerns, returning if symptoms worsen, persist, or new symptoms develop, and precautions for potential emergency department visits.

## 2024-09-13 NOTE — Assessment & Plan Note (Signed)
 Symptoms persist despite Requip  use, possibly due to statin use and iron deficiency. Discontinued Requip  and initiated gabapentin  at bedtime. Ordered iron studies and initiated a statin holiday to assess impact on symptoms.

## 2024-09-13 NOTE — Assessment & Plan Note (Signed)
 Neoplasm of uncertain behavior of skin   Large skin lesion on buttocks, possibly a skin tag. Referred to dermatology for evaluation and management.

## 2024-09-17 DIAGNOSIS — G4733 Obstructive sleep apnea (adult) (pediatric): Secondary | ICD-10-CM | POA: Diagnosis not present

## 2024-10-28 ENCOUNTER — Other Ambulatory Visit: Payer: Self-pay | Admitting: Internal Medicine

## 2024-10-28 DIAGNOSIS — G2581 Restless legs syndrome: Secondary | ICD-10-CM

## 2024-11-14 ENCOUNTER — Encounter: Admitting: Internal Medicine

## 2024-12-02 ENCOUNTER — Encounter: Admitting: Internal Medicine

## 2025-05-03 ENCOUNTER — Ambulatory Visit: Payer: Self-pay | Admitting: Physician Assistant
